# Patient Record
Sex: Male | Born: 1980 | Hispanic: Yes | Marital: Married | Smoking: Never smoker
Health system: Southern US, Community
[De-identification: ages and names within clinical notes are randomized; demographics above are authoritative.]

## PROBLEM LIST (undated history)

## (undated) DIAGNOSIS — I10 Essential (primary) hypertension: Secondary | ICD-10-CM

## (undated) DIAGNOSIS — J45909 Unspecified asthma, uncomplicated: Secondary | ICD-10-CM

---

## 2013-03-18 ENCOUNTER — Other Ambulatory Visit: Payer: Self-pay | Admitting: Occupational Medicine

## 2013-03-18 ENCOUNTER — Ambulatory Visit: Payer: Self-pay

## 2013-03-18 DIAGNOSIS — Z0000 Encounter for general adult medical examination without abnormal findings: Secondary | ICD-10-CM

## 2015-04-22 ENCOUNTER — Encounter (HOSPITAL_COMMUNITY): Payer: Self-pay | Admitting: Emergency Medicine

## 2015-04-22 ENCOUNTER — Emergency Department (HOSPITAL_COMMUNITY)
Admission: EM | Admit: 2015-04-22 | Discharge: 2015-04-23 | Disposition: A | Discharge status: Home / Self Care | Payer: 59 | Attending: Emergency Medicine | Admitting: Emergency Medicine

## 2015-04-22 DIAGNOSIS — R059 Cough, unspecified: Secondary | ICD-10-CM

## 2015-04-22 DIAGNOSIS — J45909 Unspecified asthma, uncomplicated: Secondary | ICD-10-CM | POA: Insufficient documentation

## 2015-04-22 DIAGNOSIS — I10 Essential (primary) hypertension: Secondary | ICD-10-CM | POA: Diagnosis not present

## 2015-04-22 DIAGNOSIS — R05 Cough: Secondary | ICD-10-CM | POA: Insufficient documentation

## 2015-04-22 HISTORY — DX: Essential (primary) hypertension: I10

## 2015-04-22 HISTORY — DX: Unspecified asthma, uncomplicated: J45.909

## 2015-04-22 NOTE — ED Notes (Signed)
Pt c/o SOB x1 day and non productive cough x3 weeks. One episode of emesis today after coughing, five BM today.

## 2015-04-22 NOTE — ED Provider Notes (Signed)
CSN: [id number]     Arrival date & time 04/22/15  2144 History   First MD Initiated Contact with Patient 04/22/15 2337     Chief Complaint  Patient presents with  . Cough     (Consider location/radiation/quality/duration/timing/severity/associated sxs/prior Treatment) HPI Richard Bishop is a 34 y.o. male with a history of asthma comes in for evaluation of cough. Patient states he has been coughing for the past 3 weeks. He has tried Delsym without relief. He reports associated fever and chills today. He reports his cough is nonproductive. He reports episodes of shortness of breath after coughing. He denies any chest pain, nausea or vomiting, abdominal pain, numbness or weakness, leg swelling, recent travel or surgeries, history of blood clot. No other aggravating or modifying factors. Nothing seems to make this better or worse.  Past Medical History  Diagnosis Date  . Hypertension   . Asthma    History reviewed. No pertinent past surgical history. No family history on file. Social History  Substance Use Topics  . Smoking status: Never Smoker   . Smokeless tobacco: None  . Alcohol Use: Yes    Review of Systems A 10 point review of systems was completed and was negative except for pertinent positives and negatives as mentioned in the history of present illness     Allergies  Shellfish allergy  Home Medications   Prior to Admission medications   Not on File   BP 161/89 mmHg  Pulse 90  Temp(Src) 98 F (36.7 C) (Oral)  Resp 20  SpO2 96% Physical Exam  Constitutional: He is oriented to person, place, and time. He appears well-developed and well-nourished.  HENT:  Head: Normocephalic and atraumatic.  Mouth/Throat: Oropharynx is clear and moist.  Eyes: Conjunctivae are normal. Pupils are equal, round, and reactive to light. Right eye exhibits no discharge. Left eye exhibits no discharge. No scleral icterus.  Neck: Normal range of motion. Neck supple.   Cardiovascular: Normal rate, regular rhythm and normal heart sounds.   Pulmonary/Chest: Effort normal and breath sounds normal. No respiratory distress. He has no wheezes. He has no rales.  Abdominal: Soft. There is no tenderness.  Musculoskeletal: He exhibits no tenderness.  Neurological: He is alert and oriented to person, place, and time.  Cranial Nerves II-XII grossly intact  Skin: Skin is warm and dry. No rash noted.  Psychiatric: He has a normal mood and affect.  Nursing note and vitals reviewed.   ED Course  Procedures (including critical care time) Labs Review Labs Reviewed - No data to display  Imaging Review No results found. I have personally reviewed and evaluated these images and lab results as part of my medical decision-making.   EKG Interpretation None     Meds given in ED:  Medications - No data to display  New Prescriptions   AZITHROMYCIN (ZITHROMAX) 250 MG TABLET    Take 1 tablet (250 mg total) by mouth daily. Take first 2 tablets together, then 1 every day until finished.   BENZONATATE (TESSALON) 100 MG CAPSULE    Take 1 capsule (100 mg total) by mouth every 8 (eight) hours.   DEXTROMETHORPHAN-GUAIFENESIN (MUCINEX DM) 30-600 MG PER 12 HR TABLET    Take 1 tablet by mouth 2 (two) times daily.   Filed Vitals:   04/22/15 2221  BP: 161/89  Pulse: 90  Temp: 98 F (36.7 C)  TempSrc: Oral  Resp: 20  SpO2: 96%    MDM  Patient here for evaluation of nonproductive cough  for the past 3 weeks and subjective fevers.  Vitals stable - WNL -afebrile Pt resting comfortably in ED. PE--physical exam is grossly unremarkable. Normal cardiopulmonary exam. Will treat symptomatically for cough with Tessalon and Mucinex. Given length of symptoms, will treat with azithromycin for possible pneumonia. Encourage supportive care at home. Follow-up with [facility] to establish PCP care. No evidence of other acute or emergent cardiopulmonary pathology. Low  suspicion for PE. I discussed all relevant lab findings and imaging results with pt and they verbalized understanding. Discussed f/u with PCP within 48 hrs and return precautions, pt very amenable to plan.  Final diagnoses:  Cough        Joycie Peek, PA-C 04/23/15 0030  Laurence Spates, MD 04/23/15 [phone]

## 2015-04-23 MED ORDER — DM-GUAIFENESIN ER 30-600 MG PO TB12: 1.0000 | ORAL_TABLET | Freq: Two times a day (BID) | ORAL | Status: DC

## 2015-04-23 MED ORDER — AZITHROMYCIN 250 MG PO TABS: 250.0000 mg | ORAL_TABLET | Freq: Every day | ORAL | Status: DC

## 2015-04-23 MED ORDER — BENZONATATE 100 MG PO CAPS: 100.0000 mg | ORAL_CAPSULE | Freq: Three times a day (TID) | ORAL | Status: DC

## 2015-04-23 NOTE — ED Notes (Signed)
Family members at bedside.  Pt continues to be uncooperative. 

## 2015-04-23 NOTE — ED Notes (Signed)
Blank note entry at 0006 is an error.

## 2015-04-23 NOTE — Discharge Instructions (Signed)
Please take your medications as prescribed. Follow-up with your doctor/[facility] for further evaluation management of your symptoms. Return to ED for worsening symptoms.

## 2016-08-09 ENCOUNTER — Other Ambulatory Visit: Payer: Self-pay | Admitting: Physician Assistant

## 2016-08-09 DIAGNOSIS — R748 Abnormal levels of other serum enzymes: Secondary | ICD-10-CM

## 2017-07-19 ENCOUNTER — Other Ambulatory Visit: Payer: Self-pay | Admitting: Physician Assistant

## 2017-07-19 DIAGNOSIS — R748 Abnormal levels of other serum enzymes: Secondary | ICD-10-CM

## 2018-01-07 ENCOUNTER — Other Ambulatory Visit: Payer: Self-pay | Admitting: Physician Assistant

## 2018-01-07 DIAGNOSIS — R945 Abnormal results of liver function studies: Principal | ICD-10-CM

## 2018-01-07 DIAGNOSIS — R7989 Other specified abnormal findings of blood chemistry: Secondary | ICD-10-CM

## 2018-01-14 ENCOUNTER — Other Ambulatory Visit: Payer: Self-pay

## 2018-01-24 ENCOUNTER — Other Ambulatory Visit: Payer: Self-pay

## 2018-05-22 ENCOUNTER — Ambulatory Visit: Payer: Self-pay | Admitting: Orthopedic Surgery

## 2018-05-27 ENCOUNTER — Ambulatory Visit: Payer: Self-pay | Admitting: Orthopedic Surgery

## 2018-05-27 NOTE — H&P (Signed)
Richard Bishop is an 37 y.o. male.   Chief Complaint: back and L leg pain HPI: Reason for Visit: (normal) visit for: (back); The patient is 5 months out from when symptoms began. Location (Lower Extremity): lower back pain on the left; left buttock pain Severity: pain level 4/10 Timing: constant Aggravating Factors: standing for Alleviating Factors: PT/OT; home exercise Are you working? modified duty; The patient was placed on a 10lb. lifting restriction, no bending, stooping, or squatting, and no prolonged sitting or standing. He is out of work due to light duty is not available. Medications: The patient is not taking any medication. Notes: The patient is 2 weeks and 2 days out from [facility] @ L4-5.  Past Medical History:  Diagnosis Date  . Asthma   . Hypertension     No past surgical history on file.  No family history on file. Social History:  reports that he has never smoked. He does not have any smokeless tobacco history on file. He reports that he drinks alcohol. He reports that he does not use drugs.  Allergies:  Allergies  Allergen Reactions  . Shellfish Allergy Anaphylaxis, Swelling, Rash and Other (See Comments)    *has to be fresh* Swelling of mouth     (Not in a hospital admission)  No results found for this or any previous visit (from the past 48 hour(s)). No results found.  Review of Systems  Constitutional: Negative.   HENT: Negative.   Eyes: Negative.   Respiratory: Negative.   Cardiovascular: Negative.   Gastrointestinal: Negative.   Genitourinary: Negative.   Musculoskeletal: Positive for back pain.  Skin: Negative.   Neurological: Positive for sensory change and focal weakness.  Psychiatric/Behavioral: Negative.     There were no vitals taken for this visit. Physical Exam  Constitutional: He is oriented to person, place, and time. He appears well-developed and well-nourished.  HENT:  Head: Normocephalic.  Eyes: Pupils are equal,  round, and reactive to light.  Neck: Normal range of motion.  Cardiovascular: Normal rate.  Respiratory: Effort normal.  GI: Soft.  Musculoskeletal:  Patient is a 37-year-old male.  Gait and Station: Appearance: ambulating with no assistive devices and antalgic gait.  Constitutional: General Appearance: healthy-appearing and distress (mild).  Psychiatric: Mood and Affect: active and alert.  Cardiovascular System: Edema Right: none; Dorsalis and posterior tibial pulses 2+. Edema Left: none.  Abdomen: Inspection and Palpation: non-distended and no tenderness.  Skin: Inspection and palpation: no rash.  Lumbar Spine: Inspection: normal alignment. Bony Palpation of the Lumbar Spine: tender at lumbosacral junction.. Bony Palpation of the Right Hip: no tenderness of the greater trochanter and tenderness of the SI joint; Pelvis stable. Bony Palpation of the Left Hip: no tenderness of the greater trochanter and tenderness of the SI joint. Soft Tissue Palpation on the Right: No flank pain with percussion. Active Range of Motion: limited flexion and extention.  Motor Strength: L1 Motor Strength on the Right: hip flexion iliopsoas 5/5. L1 Motor Strength on the Left: hip flexion iliopsoas 5/5. L2-L4 Motor Strength on the Right: knee extension quadriceps 5/5. L2-L4 Motor Strength on the Left: knee extension quadriceps 5/5. L5 Motor Strength on the Right: ankle dorsiflexion tibialis anterior 5/5 and great toe extension extensor hallucis longus 5/5. L5 Motor Strength on the Left: ankle dorsiflexion tibialis anterior 5/5 and great toe extension extensor hallucis longus 4/5. S1 Motor Strength on the Right: plantar flexion gastrocnemius 5/5. S1 Motor Strength on the Left: plantar flexion gastrocnemius 5/5.  Neurological System:   Knee Reflex Right: normal (2). Knee Reflex Left: normal (2). Ankle Reflex Right: normal (2). Ankle Reflex Left: normal (2). Babinski Reflex Right: plantar reflex absent. Babinski Reflex  Left: plantar reflex absent. Sensation on the Right: normal distal extremities. Sensation on the Left: normal distal extremities. Special Tests on the Right: no clonus of the ankle/knee and seated straight leg raising test positive. Special Tests on the Left: no clonus of the ankle/knee and seated straight leg raising test positive.  Normal cervical lordosis. No pain with range of motion. No palpable tenderness. Motor is 5/5 in all groups in the upper extremities. Upper extremity sensory exam normal. Patient is normoreflexic in the upper extremities. No Hoffmann sign.  Neurological: He is alert and oriented to person, place, and time.    Rereview his MRI demonstrates a moderately large disc herniation 4 5 paracentral to the left displacing the L5 nerve root against the lamina. Repeat AP x-ray of the lumbar spine demonstrates a mild scoliosis. Less than when initially seen.  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  He has had increasing weakness since seen last. He had minimal relief from an epidural steroid injection. He has undergone an extensive conservative course including form of supervised physical therapy, activity modification, light duty.  This point time we discussed 2 options either living with the symptoms versus consideration of a microlumbar decompression.  Patient reports he is unable to work is unable to increase his lifting without radicular pain. Given the failure to improve with conservative treatment and in the presence of an increasing neurologic deficit we discussed proceeding with microlumbar decompression at L4-5 on the left.  I had an extensive discussion with the patient concerning the pathology relevant anatomy and treatment options. At this point exhausting conservative treatment and in the presence of a neurologic deficit we discussed microlumbar decompression. I discussed the risks and  benefits including bleeding, infection, DVT, PE, anesthetic complications, worsening in their symptoms, improvement in their symptoms, C SF leakage, epidural fibrosis, need for future surgeries such as revision discectomy and lumbar fusion. I also indicated that this is an operation to basically decompress the nerve root to allow recovery as opposed to fixing a herniated disc and that the incidence of recurrent chest disc herniation can approach 15%. Also that nerve root recovery is variable and may not recover completely.  I discussed the operative course including overnight in the hospital. Immediate ambulation. Follow-up in 2 weeks for suture removal. 6 weeks until healing of the herniation followed by 6 weeks of reconditioning and strengthening of the core musculature. Also discussed the need to employ the concepts of disc pressure management and core motion following the surgery to minimize the risk of recurrent disc herniation. We will obtain preoperative clearance i if necessary and proceed accordingly.  Patient is otherwise healthy other than hypertension. He reports it is controlled on medication.  No history of MRSA. No family history of early heart attack or stroke.  I do feel this is related to his initial work related injury.  We discussed after that however most likely restrictions and the medium job classification due to his two level disc degeneration and residual from his disc herniation.  Continue with his current work status.  Light duty to consist of no lifting over 10 pounds, no prolonged sitting or standing over an hour at a time without the ability to change positions, no repetitive or unsupported bending. No climbing. Occasional squatting only.    Plan microlumbar decompression L4-5 left  Titan Karner M., PA-C for Dr. Beane 05/27/2018, 1:40 PM   

## 2018-05-27 NOTE — H&P (View-Only) (Signed)
Richard Bishop is an 37 y.o. male.   Chief Complaint: back and L leg pain HPI: Reason for Visit: (normal) visit for: (back); The patient is 5 months out from when symptoms began. Location (Lower Extremity): lower back pain on the left; left buttock pain Severity: pain level 4/10 Timing: constant Aggravating Factors: standing for Alleviating Factors: PT/OT; home exercise Are you working? modified duty; The patient was placed on a 10lb. lifting restriction, no bending, stooping, or squatting, and no prolonged sitting or standing. He is out of work due to [employer] duty is not available. Medications: The patient is not taking any medication. Notes: The patient is 2 weeks and 2 days out from [facility] @ L4-5.  Past Medical History:  Diagnosis Date  . Asthma   . Hypertension     No past surgical history on file.  No family history on file. Social History:  reports that he has never smoked. He does not have any smokeless tobacco history on file. He reports that he drinks alcohol. He reports that he does not use drugs.  Allergies:  Allergies  Allergen Reactions  . Shellfish Allergy Anaphylaxis, Swelling, Rash and Other (See Comments)    *has to be fresh* Swelling of mouth     (Not in a hospital admission)  No results found for this or any previous visit (from the past 48 hour(s)). No results found.  Review of Systems  Constitutional: Negative.   HENT: Negative.   Eyes: Negative.   Respiratory: Negative.   Cardiovascular: Negative.   Gastrointestinal: Negative.   Genitourinary: Negative.   Musculoskeletal: Positive for back pain.  Skin: Negative.   Neurological: Positive for sensory change and focal weakness.  Psychiatric/Behavioral: Negative.     There were no vitals taken for this visit. Physical Exam  Constitutional: He is oriented to person, place, and time. He appears well-developed and well-nourished.  HENT:  Head: Normocephalic.  Eyes: Pupils are equal,  round, and reactive to light.  Neck: Normal range of motion.  Cardiovascular: Normal rate.  Respiratory: Effort normal.  GI: Soft.  Musculoskeletal:  Patient is a 37 year old male.  Gait and Station: Appearance: ambulating with no assistive devices and antalgic gait.  Constitutional: General Appearance: healthy-appearing and distress (mild).  Psychiatric: Mood and Affect: active and alert.  Cardiovascular System: Edema Right: none; Dorsalis and posterior tibial pulses 2+. Edema Left: none.  Abdomen: Inspection and Palpation: non-distended and no tenderness.  Skin: Inspection and palpation: no rash.  Lumbar Spine: Inspection: normal alignment. Bony Palpation of the Lumbar Spine: tender at lumbosacral junction.. Bony Palpation of the Right Hip: no tenderness of the greater trochanter and tenderness of the SI joint; Pelvis stable. Bony Palpation of the Left Hip: no tenderness of the greater trochanter and tenderness of the SI joint. Soft Tissue Palpation on the Right: No flank pain with percussion. Active Range of Motion: limited flexion and extention.  Motor Strength: L1 Motor Strength on the Right: hip flexion iliopsoas 5/5. L1 Motor Strength on the Left: hip flexion iliopsoas 5/5. L2-L4 Motor Strength on the Right: knee extension quadriceps 5/5. L2-L4 Motor Strength on the Left: knee extension quadriceps 5/5. L5 Motor Strength on the Right: ankle dorsiflexion tibialis anterior 5/5 and great toe extension extensor hallucis longus 5/5. L5 Motor Strength on the Left: ankle dorsiflexion tibialis anterior 5/5 and great toe extension extensor hallucis longus 4/5. S1 Motor Strength on the Right: plantar flexion gastrocnemius 5/5. S1 Motor Strength on the Left: plantar flexion gastrocnemius 5/5.  Neurological System:  Knee Reflex Right: normal (2). Knee Reflex Left: normal (2). Ankle Reflex Right: normal (2). Ankle Reflex Left: normal (2). Babinski Reflex Right: plantar reflex absent. Babinski Reflex  Left: plantar reflex absent. Sensation on the Right: normal distal extremities. Sensation on the Left: normal distal extremities. Special Tests on the Right: no clonus of the ankle/knee and seated straight leg raising test positive. Special Tests on the Left: no clonus of the ankle/knee and seated straight leg raising test positive.  Normal cervical lordosis. No pain with range of motion. No palpable tenderness. Motor is 5/5 in all groups in the upper extremities. Upper extremity sensory exam normal. Patient is normoreflexic in the upper extremities. No Hoffmann sign.  Neurological: He is alert and oriented to person, place, and time.    Rereview his MRI demonstrates a moderately large disc herniation 4 5 paracentral to the left displacing the L5 nerve root against the lamina. Repeat AP x-ray of the lumbar spine demonstrates a mild scoliosis. Less than when initially seen.  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  He has had increasing weakness since seen last. He had minimal relief from an epidural steroid injection. He has undergone an extensive conservative course including form of supervised physical therapy, activity modification, light duty.  This point time we discussed 2 options either living with the symptoms versus consideration of a microlumbar decompression.  Patient reports he is unable to work is unable to increase his lifting without radicular pain. Given the failure to improve with conservative treatment and in the presence of an increasing neurologic deficit we discussed proceeding with microlumbar decompression at L4-5 on the left.  I had an extensive discussion with the patient concerning the pathology relevant anatomy and treatment options. At this point exhausting conservative treatment and in the presence of a neurologic deficit we discussed microlumbar decompression. I discussed the risks and  benefits including bleeding, infection, DVT, PE, anesthetic complications, worsening in their symptoms, improvement in their symptoms, C SF leakage, epidural fibrosis, need for future surgeries such as revision discectomy and lumbar fusion. I also indicated that this is an operation to basically decompress the nerve root to allow recovery as opposed to fixing a herniated disc and that the incidence of recurrent chest disc herniation can approach 15%. Also that nerve root recovery is variable and may not recover completely.  I discussed the operative course including overnight in the hospital. Immediate ambulation. Follow-up in 2 weeks for suture removal. 6 weeks until healing of the herniation followed by 6 weeks of reconditioning and strengthening of the core musculature. Also discussed the need to employ the concepts of disc pressure management and core motion following the surgery to minimize the risk of recurrent disc herniation. We will obtain preoperative clearance i if necessary and proceed accordingly.  Patient is otherwise healthy other than hypertension. He reports it is controlled on medication.  No history of MRSA. No family history of early heart attack or stroke.  I do feel this is related to his initial work related injury.  We discussed after that however most likely restrictions and the medium job classification due to his two level disc degeneration and residual from his disc herniation.  Continue with his current work status.  Light duty to consist of no lifting over 10 pounds, no prolonged sitting or standing over an hour at a time without the ability to change positions, no repetitive or unsupported bending. No climbing. Occasional squatting only.  Plan microlumbar decompression L4-5 left  BISSELL, Dayna Barker., PA-C for Dr. Shelle Iron 05/27/2018, 1:40 PM

## 2018-05-29 NOTE — Pre-Procedure Instructions (Signed)
[facility]  05/29/2018      [id number] [place],  - [address] AT [facility] [address] [place] [address] Phone: [phone] Fax: [phone]    Your procedure is scheduled on October 24th.  Report to [facility] Admitting at 8:45 A.M.  Call this number if you have problems the morning of surgery:  [phone]   Remember:  Do not eat or drink after midnight.    7 days prior to surgery STOP taking any Aspirin (unless otherwise instructed by your surgeon), Aleve, Naproxen, Ibuprofen, Motrin, Advil, Goody's, BC's, all herbal medications, fish oil, and all vitamins      Do not wear jewelry, make-up or nail polish.  Do not wear lotions, powders, or perfumes, or deodorant.  Do not shave 48 hours prior to surgery.  Men may shave face and neck.  Do not bring valuables to the hospital.  [facility] is not responsible for any belongings or valuables.   [facility]- Preparing For Surgery  Before surgery, you can play an important role. Because skin is not sterile, your skin needs to be as free of germs as possible. You can reduce the number of germs on your skin by washing with CHG (chlorahexidine gluconate) Soap before surgery.  CHG is an antiseptic cleaner which kills germs and bonds with the skin to continue killing germs even after washing.    Oral Hygiene is also important to reduce your risk of infection.  Remember - BRUSH YOUR TEETH THE MORNING OF SURGERY WITH YOUR REGULAR TOOTHPASTE  Please do not use if you have an allergy to CHG or antibacterial soaps. If your skin becomes reddened/irritated stop using the CHG.  Do not shave (including legs and underarms) for at least 48 hours prior to first CHG shower. It is OK to shave your face.  Please follow these instructions carefully.   1. Shower the NIGHT BEFORE SURGERY and the MORNING OF SURGERY with CHG.   2. If you chose to  wash your hair, wash your hair first as usual with your normal shampoo.  3. After you shampoo, rinse your hair and body thoroughly to remove the shampoo.  4. Use CHG as you would any other liquid soap. You can apply CHG directly to the skin and wash gently with a scrungie or a clean washcloth.   5. Apply the CHG Soap to your body ONLY FROM THE NECK DOWN.  Do not use on open wounds or open sores. Avoid contact with your eyes, ears, mouth and genitals (private parts). Wash Face and genitals (private parts)  with your normal soap.  6. Wash thoroughly, paying special attention to the area where your surgery will be performed.  7. Thoroughly rinse your body with warm water from the neck down.  8. DO NOT shower/wash with your normal soap after using and rinsing off the CHG Soap.  9. Pat yourself dry with a CLEAN TOWEL.  10. Wear CLEAN PAJAMAS to bed the night before surgery, wear comfortable clothes the morning of surgery  11. Place CLEAN SHEETS on your bed the night of your first shower and DO NOT SLEEP WITH PETS.    Day of Surgery:  Do not apply any deodorants/lotions.  Please wear clean clothes to the hospital/surgery center.   Remember to brush your teeth WITH YOUR REGULAR TOOTHPASTE.    Contacts, dentures or bridgework may not be worn  into surgery.  Leave your suitcase in the car.  After surgery it may be brought to your room.  For patients admitted to the hospital, discharge time will be determined by your treatment team.  Patients discharged the day of surgery will not be allowed to drive home.   Please read over the following fact sheets that you were given. Coughing and Deep Breathing and Surgical Site Infection Prevention

## 2018-05-30 ENCOUNTER — Encounter (HOSPITAL_COMMUNITY)
Admission: RE | Admit: 2018-05-30 | Discharge: 2018-05-30 | Disposition: A | Discharge status: Home / Self Care | Payer: BLUE CROSS/BLUE SHIELD | Source: Ambulatory Visit | Attending: Specialist | Admitting: Specialist

## 2018-05-30 ENCOUNTER — Encounter (HOSPITAL_COMMUNITY)
Admission: RE | Admit: 2018-05-30 | Discharge: 2018-05-30 | Disposition: A | Discharge status: Home / Self Care | Payer: BLUE CROSS/BLUE SHIELD | Source: Ambulatory Visit | Attending: Orthopedic Surgery | Admitting: Orthopedic Surgery

## 2018-05-30 ENCOUNTER — Other Ambulatory Visit: Payer: Self-pay

## 2018-05-30 ENCOUNTER — Encounter (HOSPITAL_COMMUNITY): Payer: Self-pay

## 2018-05-30 DIAGNOSIS — Z01818 Encounter for other preprocedural examination: Secondary | ICD-10-CM | POA: Insufficient documentation

## 2018-05-30 DIAGNOSIS — M5126 Other intervertebral disc displacement, lumbar region: Secondary | ICD-10-CM

## 2018-05-30 DIAGNOSIS — Z01812 Encounter for preprocedural laboratory examination: Secondary | ICD-10-CM | POA: Diagnosis present

## 2018-05-30 LAB — CBC
HEMATOCRIT: 45.2 % (ref 39.0–52.0)
HEMOGLOBIN: 15.2 g/dL (ref 13.0–17.0)
MCH: 30.5 pg (ref 26.0–34.0)
MCHC: 33.6 g/dL (ref 30.0–36.0)
MCV: 90.8 fL (ref 80.0–100.0)
Platelets: 293 10*3/uL (ref 150–400)
RBC: 4.98 MIL/uL (ref 4.22–5.81)
RDW: 11.9 % (ref 11.5–15.5)
WBC: 5.2 10*3/uL (ref 4.0–10.5)
nRBC: 0 % (ref 0.0–0.2)

## 2018-05-30 LAB — BASIC METABOLIC PANEL
Anion gap: 7 (ref 5–15)
BUN: 9 mg/dL (ref 6–20)
CHLORIDE: 105 mmol/L (ref 98–111)
CO2: 22 mmol/L (ref 22–32)
Calcium: 8.7 mg/dL — ABNORMAL LOW (ref 8.9–10.3)
Creatinine, Ser: 0.8 mg/dL (ref 0.61–1.24)
GFR calc Af Amer: >60 mL/min (ref >60)
GFR calc non Af Amer: >60 mL/min (ref >60)
GLUCOSE: 238 mg/dL — AB (ref 70–99)
POTASSIUM: 3.8 mmol/L (ref 3.5–5.1)
Sodium: 134 mmol/L — ABNORMAL LOW (ref 135–145)

## 2018-05-30 LAB — SURGICAL PCR SCREEN
MRSA, PCR: NEGATIVE
STAPHYLOCOCCUS AUREUS: POSITIVE — AB

## 2018-05-30 LAB — HEMOGLOBIN A1C
Hgb A1c MFr Bld: 6 % — ABNORMAL HIGH (ref 4.8–5.6)
Mean Plasma Glucose: 125.5 mg/dL

## 2018-05-30 NOTE — Progress Notes (Signed)
Denies any murmur, cp, sob. PCP is Dr. Nicole Cella Scifres LOV 11/2017 He is comfortable speaking english throughout the PAT process.  I asked several times if he wanted an interpreter.  "no"

## 2018-05-30 NOTE — Progress Notes (Signed)
Surgical PCR +staph. Mupirocin called into [facility] [phone]. Informed patient of result and told patient to pick up Rx and start using as soon as possible. Verbalized understanding.

## 2018-05-30 NOTE — Progress Notes (Signed)
   05/30/18 1421  OBSTRUCTIVE SLEEP APNEA  Score 5 or greater  Results sent to PCP

## 2018-06-02 ENCOUNTER — Ambulatory Visit: Payer: Self-pay | Admitting: Orthopedic Surgery

## 2018-06-04 MED ORDER — DEXTROSE 5 % IV SOLN
3.0000 g | INTRAVENOUS | Status: AC
  Administered 2018-06-05: 3 g via INTRAVENOUS
  Filled 2018-06-04: qty 3

## 2018-06-04 MED ORDER — CLINDAMYCIN PHOSPHATE 900 MG/50ML IV SOLN
900.0000 mg | INTRAVENOUS | Status: AC
  Administered 2018-06-05: 900 mg via INTRAVENOUS
  Filled 2018-06-04: qty 50

## 2018-06-04 MED ORDER — ACETAMINOPHEN 10 MG/ML IV SOLN
1000.0000 mg | INTRAVENOUS | Status: AC
  Administered 2018-06-05: 1000 mg via INTRAVENOUS
  Filled 2018-06-04: qty 100

## 2018-06-04 NOTE — Anesthesia Preprocedure Evaluation (Addendum)
Anesthesia Evaluation  Patient identified by MRN, date of birth, ID band Patient awake    Reviewed: Allergy & Precautions, NPO status , Patient's Chart, lab work & pertinent test results  History of Anesthesia Complications Negative for: history of anesthetic complications  Airway Mallampati: II  TM Distance: >3 FB Neck ROM: Full    Dental no notable dental hx. (+) Teeth Intact, Dental Advisory Given   Pulmonary neg pulmonary ROS,    Pulmonary exam normal breath sounds clear to auscultation       Cardiovascular hypertension, Pt. 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 negative psych ROS   GI/Hepatic negative GI ROS, Neg liver ROS,   Endo/Other  Morbid obesity  Renal/GU negative Renal ROS     Musculoskeletal negative musculoskeletal ROS (+)   Abdominal (+) + obese,   Peds  Hematology negative hematology ROS (+)   Anesthesia Other Findings   Reproductive/Obstetrics                            Anesthesia Physical Anesthesia Plan  ASA: III  Anesthesia Plan: General   Post-op Pain Management:    Induction: Intravenous  PONV Risk Score and Plan: 2 and Treatment may vary due to age or medical condition, Ondansetron and Dexamethasone  Airway Management Planned: Oral ETT  Additional Equipment:   Intra-op Plan:   Post-operative Plan: Extubation in OR  Informed Consent: I have reviewed the patients History and Physical, chart, labs and discussed the procedure including the risks, benefits and alternatives for the proposed anesthesia with the patient or authorized representative who has indicated his/her understanding and acceptance.   Dental advisory given  Plan Discussed with: CRNA and Surgeon  Anesthesia Plan Comments:        Anesthesia Quick Evaluation

## 2018-06-05 ENCOUNTER — Other Ambulatory Visit: Payer: Self-pay

## 2018-06-05 ENCOUNTER — Ambulatory Visit (HOSPITAL_COMMUNITY)
Admission: RE | Disposition: A | Discharge status: Home / Self Care | Payer: Self-pay | Source: Ambulatory Visit | Attending: Specialist

## 2018-06-05 ENCOUNTER — Ambulatory Visit (HOSPITAL_COMMUNITY): Payer: BLUE CROSS/BLUE SHIELD

## 2018-06-05 ENCOUNTER — Encounter (HOSPITAL_COMMUNITY): Payer: Self-pay | Admitting: *Deleted

## 2018-06-05 ENCOUNTER — Ambulatory Visit (HOSPITAL_COMMUNITY): Payer: BLUE CROSS/BLUE SHIELD | Admitting: Physician Assistant

## 2018-06-05 ENCOUNTER — Ambulatory Visit (HOSPITAL_COMMUNITY)
Admission: RE | Admit: 2018-06-05 | Discharge: 2018-06-06 | Disposition: A | Discharge status: Home / Self Care | Payer: BLUE CROSS/BLUE SHIELD | Source: Ambulatory Visit | Attending: Specialist | Admitting: Specialist

## 2018-06-05 ENCOUNTER — Ambulatory Visit (HOSPITAL_COMMUNITY): Payer: BLUE CROSS/BLUE SHIELD | Admitting: Anesthesiology

## 2018-06-05 DIAGNOSIS — Z91013 Allergy to seafood: Secondary | ICD-10-CM | POA: Diagnosis not present

## 2018-06-05 DIAGNOSIS — R7303 Prediabetes: Secondary | ICD-10-CM

## 2018-06-05 DIAGNOSIS — Z79899 Other long term (current) drug therapy: Secondary | ICD-10-CM | POA: Diagnosis not present

## 2018-06-05 DIAGNOSIS — M5126 Other intervertebral disc displacement, lumbar region: Secondary | ICD-10-CM | POA: Diagnosis not present

## 2018-06-05 DIAGNOSIS — Z6841 Body Mass Index (BMI) 40.0 and over, adult: Secondary | ICD-10-CM | POA: Insufficient documentation

## 2018-06-05 DIAGNOSIS — R739 Hyperglycemia, unspecified: Secondary | ICD-10-CM | POA: Diagnosis not present

## 2018-06-05 DIAGNOSIS — M48061 Spinal stenosis, lumbar region without neurogenic claudication: Secondary | ICD-10-CM | POA: Diagnosis present

## 2018-06-05 DIAGNOSIS — I10 Essential (primary) hypertension: Secondary | ICD-10-CM

## 2018-06-05 DIAGNOSIS — Z419 Encounter for procedure for purposes other than remedying health state, unspecified: Secondary | ICD-10-CM

## 2018-06-05 HISTORY — PX: LUMBAR LAMINECTOMY/DECOMPRESSION MICRODISCECTOMY: SHX5026

## 2018-06-05 SURGERY — LUMBAR LAMINECTOMY/DECOMPRESSION MICRODISCECTOMY 1 LEVEL
Anesthesia: General | Site: Spine Lumbar | Laterality: Left

## 2018-06-05 MED ORDER — ONDANSETRON HCL 4 MG/2ML IJ SOLN
INTRAMUSCULAR | Status: DC | PRN
  Administered 2018-06-05: 4 mg via INTRAVENOUS

## 2018-06-05 MED ORDER — MIDAZOLAM HCL 5 MG/5ML IJ SOLN
INTRAMUSCULAR | Status: DC | PRN
  Administered 2018-06-05: 2 mg via INTRAVENOUS

## 2018-06-05 MED ORDER — DOCUSATE SODIUM 100 MG PO CAPS: 100.0000 mg | ORAL_CAPSULE | Freq: Two times a day (BID) | ORAL | 2 refills | Status: AC

## 2018-06-05 MED ORDER — MIDAZOLAM HCL 2 MG/2ML IJ SOLN
INTRAMUSCULAR | Status: AC
  Filled 2018-06-05: qty 2

## 2018-06-05 MED ORDER — SODIUM CHLORIDE 0.9 % IV SOLN
INTRAVENOUS | Status: DC | PRN
  Administered 2018-06-05: 13:00:00

## 2018-06-05 MED ORDER — INSULIN ASPART 100 UNIT/ML ~~LOC~~ SOLN: 0.0000 [IU] | Freq: Three times a day (TID) | SUBCUTANEOUS | Status: DC

## 2018-06-05 MED ORDER — BUPIVACAINE-EPINEPHRINE 0.5% -1:200000 IJ SOLN
INTRAMUSCULAR | Status: AC
  Filled 2018-06-05: qty 1

## 2018-06-05 MED ORDER — METHOCARBAMOL 1000 MG/10ML IJ SOLN: 500.0000 mg | Freq: Four times a day (QID) | INTRAVENOUS | Status: DC | PRN

## 2018-06-05 MED ORDER — METHOCARBAMOL 500 MG PO TABS: 500.0000 mg | ORAL_TABLET | Freq: Four times a day (QID) | ORAL | 1 refills | Status: AC | PRN

## 2018-06-05 MED ORDER — OXYCODONE HCL 5 MG/5ML PO SOLN: 5.0000 mg | Freq: Once | ORAL | Status: DC | PRN

## 2018-06-05 MED ORDER — DOCUSATE SODIUM 100 MG PO CAPS
100.0000 mg | ORAL_CAPSULE | Freq: Two times a day (BID) | ORAL | Status: DC
  Administered 2018-06-05: 100 mg via ORAL
  Filled 2018-06-05: qty 1

## 2018-06-05 MED ORDER — OXYCODONE HCL 5 MG PO TABS
10.0000 mg | ORAL_TABLET | ORAL | Status: DC | PRN
  Administered 2018-06-05 – 2018-06-06 (×4): 10 mg via ORAL
  Filled 2018-06-05 (×4): qty 2

## 2018-06-05 MED ORDER — POLYETHYLENE GLYCOL 3350 17 G PO PACK: 17.0000 g | PACK | Freq: Every day | ORAL | 0 refills | Status: AC

## 2018-06-05 MED ORDER — PROPOFOL 10 MG/ML IV BOLUS
INTRAVENOUS | Status: AC
  Filled 2018-06-05: qty 20

## 2018-06-05 MED ORDER — LISINOPRIL 20 MG PO TABS: 40.0000 mg | ORAL_TABLET | Freq: Every day | ORAL | Status: DC

## 2018-06-05 MED ORDER — PROMETHAZINE HCL 25 MG/ML IJ SOLN: 6.2500 mg | INTRAMUSCULAR | Status: DC | PRN

## 2018-06-05 MED ORDER — BUPIVACAINE-EPINEPHRINE 0.5% -1:200000 IJ SOLN
INTRAMUSCULAR | Status: DC | PRN
  Administered 2018-06-05: 14 mL

## 2018-06-05 MED ORDER — LACTATED RINGERS IV SOLN
INTRAVENOUS | Status: DC
  Administered 2018-06-05: 10:00:00 via INTRAVENOUS

## 2018-06-05 MED ORDER — EPHEDRINE SULFATE-NACL 50-0.9 MG/10ML-% IV SOSY
PREFILLED_SYRINGE | INTRAVENOUS | Status: DC | PRN
  Administered 2018-06-05: 5 mg via INTRAVENOUS

## 2018-06-05 MED ORDER — FENTANYL CITRATE (PF) 250 MCG/5ML IJ SOLN
INTRAMUSCULAR | Status: AC
  Filled 2018-06-05: qty 5

## 2018-06-05 MED ORDER — CEFAZOLIN SODIUM-DEXTROSE 2-4 GM/100ML-% IV SOLN
2.0000 g | Freq: Three times a day (TID) | INTRAVENOUS | Status: AC
  Administered 2018-06-05 – 2018-06-06 (×2): 2 g via INTRAVENOUS
  Filled 2018-06-05 (×2): qty 100

## 2018-06-05 MED ORDER — OXYCODONE HCL 5 MG PO TABS: 5.0000 mg | ORAL_TABLET | Freq: Once | ORAL | Status: DC | PRN

## 2018-06-05 MED ORDER — FENTANYL CITRATE (PF) 100 MCG/2ML IJ SOLN
INTRAMUSCULAR | Status: DC | PRN
  Administered 2018-06-05 (×2): 100 ug via INTRAVENOUS
  Administered 2018-06-05: 50 ug via INTRAVENOUS

## 2018-06-05 MED ORDER — MAGNESIUM CITRATE PO SOLN: 1.0000 | Freq: Once | ORAL | Status: DC | PRN

## 2018-06-05 MED ORDER — BISACODYL 5 MG PO TBEC: 5.0000 mg | DELAYED_RELEASE_TABLET | Freq: Every day | ORAL | Status: DC | PRN

## 2018-06-05 MED ORDER — ACETAMINOPHEN 325 MG PO TABS: 650.0000 mg | ORAL_TABLET | ORAL | Status: DC | PRN

## 2018-06-05 MED ORDER — SODIUM CHLORIDE 0.9 % IV SOLN
INTRAVENOUS | Status: DC | PRN
  Administered 2018-06-05: 25 ug/min via INTRAVENOUS

## 2018-06-05 MED ORDER — POTASSIUM CHLORIDE IN NACL 20-0.9 MEQ/L-% IV SOLN
INTRAVENOUS | Status: DC
  Administered 2018-06-05: 20:00:00 via INTRAVENOUS

## 2018-06-05 MED ORDER — OXYCODONE HCL 5 MG PO TABS: 5.0000 mg | ORAL_TABLET | ORAL | Status: DC | PRN

## 2018-06-05 MED ORDER — HYDROMORPHONE HCL 1 MG/ML IJ SOLN: 1.0000 mg | INTRAMUSCULAR | Status: DC | PRN

## 2018-06-05 MED ORDER — ACETAMINOPHEN 650 MG RE SUPP: 650.0000 mg | RECTAL | Status: DC | PRN

## 2018-06-05 MED ORDER — OXYCODONE HCL 5 MG PO TABS: 5.0000 mg | ORAL_TABLET | ORAL | 0 refills | Status: AC | PRN

## 2018-06-05 MED ORDER — THROMBIN 20000 UNITS EX SOLR
CUTANEOUS | Status: DC | PRN
  Administered 2018-06-05: 13:00:00 via TOPICAL

## 2018-06-05 MED ORDER — ACETAMINOPHEN 10 MG/ML IV SOLN: 1000.0000 mg | Freq: Once | INTRAVENOUS | Status: DC | PRN

## 2018-06-05 MED ORDER — METHOCARBAMOL 500 MG PO TABS
500.0000 mg | ORAL_TABLET | Freq: Four times a day (QID) | ORAL | Status: DC | PRN
  Administered 2018-06-05 – 2018-06-06 (×2): 500 mg via ORAL
  Filled 2018-06-05 (×2): qty 1

## 2018-06-05 MED ORDER — PROPOFOL 10 MG/ML IV BOLUS
INTRAVENOUS | Status: DC | PRN
  Administered 2018-06-05: 200 mg via INTRAVENOUS

## 2018-06-05 MED ORDER — FENTANYL CITRATE (PF) 100 MCG/2ML IJ SOLN
INTRAMUSCULAR | Status: AC
  Filled 2018-06-05: qty 2

## 2018-06-05 MED ORDER — 0.9 % SODIUM CHLORIDE (POUR BTL) OPTIME
TOPICAL | Status: DC | PRN
  Administered 2018-06-05: 1000 mL

## 2018-06-05 MED ORDER — LACTATED RINGERS IV SOLN
INTRAVENOUS | Status: DC | PRN
  Administered 2018-06-05 (×2): via INTRAVENOUS

## 2018-06-05 MED ORDER — PHENYLEPHRINE 40 MCG/ML (10ML) SYRINGE FOR IV PUSH (FOR BLOOD PRESSURE SUPPORT)
PREFILLED_SYRINGE | INTRAVENOUS | Status: DC | PRN
  Administered 2018-06-05: 120 ug via INTRAVENOUS
  Administered 2018-06-05: 80 ug via INTRAVENOUS
  Administered 2018-06-05: 40 ug via INTRAVENOUS
  Administered 2018-06-05: 80 ug via INTRAVENOUS

## 2018-06-05 MED ORDER — FENTANYL CITRATE (PF) 100 MCG/2ML IJ SOLN
25.0000 ug | INTRAMUSCULAR | Status: DC | PRN
  Administered 2018-06-05 (×2): 50 ug via INTRAVENOUS

## 2018-06-05 MED ORDER — SUGAMMADEX SODIUM 200 MG/2ML IV SOLN
INTRAVENOUS | Status: DC | PRN
  Administered 2018-06-05: 250 mg via INTRAVENOUS

## 2018-06-05 MED ORDER — THROMBIN (RECOMBINANT) 20000 UNITS EX SOLR
CUTANEOUS | Status: AC
  Filled 2018-06-05: qty 20000

## 2018-06-05 MED ORDER — LACTATED RINGERS IV SOLN: INTRAVENOUS | Status: DC

## 2018-06-05 MED ORDER — MENTHOL 3 MG MT LOZG
1.0000 | LOZENGE | OROMUCOSAL | Status: DC | PRN
  Administered 2018-06-05: 3 mg via ORAL
  Filled 2018-06-05: qty 9

## 2018-06-05 MED ORDER — ROCURONIUM BROMIDE 10 MG/ML (PF) SYRINGE
PREFILLED_SYRINGE | INTRAVENOUS | Status: DC | PRN
  Administered 2018-06-05 (×2): 10 mg via INTRAVENOUS
  Administered 2018-06-05: 50 mg via INTRAVENOUS
  Administered 2018-06-05: 10 mg via INTRAVENOUS

## 2018-06-05 MED ORDER — POLYETHYLENE GLYCOL 3350 17 G PO PACK: 17.0000 g | PACK | Freq: Every day | ORAL | Status: DC | PRN

## 2018-06-05 MED ORDER — RISAQUAD PO CAPS
1.0000 | ORAL_CAPSULE | Freq: Every day | ORAL | Status: DC
  Administered 2018-06-05: 1 via ORAL
  Filled 2018-06-05 (×2): qty 1

## 2018-06-05 SURGICAL SUPPLY — 59 items
BAG DECANTER FOR FLEXI CONT (MISCELLANEOUS) ×3 IMPLANT
CLEANER TIP ELECTROSURG 2X2 (MISCELLANEOUS) ×3 IMPLANT
CLOSURE WOUND 1/2 X4 (GAUZE/BANDAGES/DRESSINGS) ×1
CLOTH 2% CHLOROHEXIDINE 3PK (PERSONAL CARE ITEMS) ×3 IMPLANT
CONT SPEC 4OZ CLIKSEAL STRL BL (MISCELLANEOUS) ×3 IMPLANT
COVER WAND RF STERILE (DRAPES) ×3 IMPLANT
DRAPE LAPAROTOMY 100X72X124 (DRAPES) ×3 IMPLANT
DRAPE MICROSCOPE LEICA (MISCELLANEOUS) ×3 IMPLANT
DRAPE SHEET LG 3/4 BI-LAMINATE (DRAPES) ×3 IMPLANT
DRAPE SURG 17X11 SM STRL (DRAPES) ×3 IMPLANT
DRAPE UTILITY XL STRL (DRAPES) ×3 IMPLANT
DRSG AQUACEL AG ADV 3.5X 4 (GAUZE/BANDAGES/DRESSINGS) IMPLANT
DRSG AQUACEL AG ADV 3.5X 6 (GAUZE/BANDAGES/DRESSINGS) ×3 IMPLANT
DRSG TEGADERM 4X4.75 (GAUZE/BANDAGES/DRESSINGS) ×3 IMPLANT
DRSG TELFA 3X8 NADH (GAUZE/BANDAGES/DRESSINGS) IMPLANT
DURAPREP 26ML APPLICATOR (WOUND CARE) ×3 IMPLANT
DURASEAL SPINE SEALANT 3ML (MISCELLANEOUS) IMPLANT
ELECT BLADE 4.0 EZ CLEAN MEGAD (MISCELLANEOUS) ×3
ELECT REM PT RETURN 9FT ADLT (ELECTROSURGICAL) ×3
ELECTRODE BLDE 4.0 EZ CLN MEGD (MISCELLANEOUS) ×1 IMPLANT
ELECTRODE REM PT RTRN 9FT ADLT (ELECTROSURGICAL) ×1 IMPLANT
GAUZE SPONGE 4X4 12PLY STRL LF (GAUZE/BANDAGES/DRESSINGS) ×3 IMPLANT
GLOVE BIOGEL PI IND STRL 7.0 (GLOVE) ×1 IMPLANT
GLOVE BIOGEL PI IND STRL 8 (GLOVE) ×2 IMPLANT
GLOVE BIOGEL PI INDICATOR 7.0 (GLOVE) ×2
GLOVE BIOGEL PI INDICATOR 8 (GLOVE) ×4
GLOVE ECLIPSE 7.5 STRL STRAW (GLOVE) ×6 IMPLANT
GLOVE SURG SS PI 7.5 STRL IVOR (GLOVE) ×3 IMPLANT
GLOVE SURG SS PI 8.0 STRL IVOR (GLOVE) ×9 IMPLANT
GOWN STRL REUS W/ TWL LRG LVL3 (GOWN DISPOSABLE) ×1 IMPLANT
GOWN STRL REUS W/ TWL XL LVL3 (GOWN DISPOSABLE) ×1 IMPLANT
GOWN STRL REUS W/TWL 2XL LVL3 (GOWN DISPOSABLE) ×6 IMPLANT
GOWN STRL REUS W/TWL LRG LVL3 (GOWN DISPOSABLE) ×2
GOWN STRL REUS W/TWL XL LVL3 (GOWN DISPOSABLE) ×2
IV CATH 14GX2 1/4 (CATHETERS) ×3 IMPLANT
KIT BASIN OR (CUSTOM PROCEDURE TRAY) ×3 IMPLANT
KIT POSITION SURG JACKSON T1 (MISCELLANEOUS) ×3 IMPLANT
NEEDLE 22X1 1/2 (OR ONLY) (NEEDLE) ×3 IMPLANT
NEEDLE SPNL 18GX3.5 QUINCKE PK (NEEDLE) ×6 IMPLANT
PACK LAMINECTOMY NEURO (CUSTOM PROCEDURE TRAY) ×3 IMPLANT
PATTIES SURGICAL .75X.75 (GAUZE/BANDAGES/DRESSINGS) ×3 IMPLANT
RUBBERBAND STERILE (MISCELLANEOUS) ×6 IMPLANT
SPONGE LAP 4X18 RFD (DISPOSABLE) IMPLANT
SPONGE SURGIFOAM ABS GEL 100 (HEMOSTASIS) ×3 IMPLANT
STAPLER VISISTAT (STAPLE) IMPLANT
STRIP CLOSURE SKIN 1/2X4 (GAUZE/BANDAGES/DRESSINGS) ×2 IMPLANT
SUT NURALON 4 0 TR CR/8 (SUTURE) IMPLANT
SUT PROLENE 3 0 PS 2 (SUTURE) IMPLANT
SUT VIC AB 1 CT1 27 (SUTURE) ×4
SUT VIC AB 1 CT1 27XBRD ANBCTR (SUTURE) ×2 IMPLANT
SUT VIC AB 1 CT1 27XBRD ANTBC (SUTURE) IMPLANT
SUT VIC AB 1-0 CT2 27 (SUTURE) IMPLANT
SUT VIC AB 2-0 CT1 27 (SUTURE)
SUT VIC AB 2-0 CT1 TAPERPNT 27 (SUTURE) IMPLANT
SUT VIC AB 2-0 CT2 27 (SUTURE) IMPLANT
SYR 3ML LL SCALE MARK (SYRINGE) ×3 IMPLANT
TOWEL GREEN STERILE (TOWEL DISPOSABLE) ×3 IMPLANT
TOWEL GREEN STERILE FF (TOWEL DISPOSABLE) ×3 IMPLANT
YANKAUER SUCT BULB TIP NO VENT (SUCTIONS) ×3 IMPLANT

## 2018-06-05 NOTE — Transfer of Care (Signed)
Immediate Anesthesia Transfer of Care Note  Patient: Richard Bishop  Procedure(s) Performed: Microlumbar decompression Lumbar Four - Lumbar Five left (Left Spine Lumbar)  Patient Location: PACU  Anesthesia Type:General  Level of Consciousness: awake, alert  and oriented  Airway & Oxygen Therapy: Patient Spontanous Breathing and Patient connected to face mask oxygen  Post-op Assessment: Report given to RN, Post -op Vital signs reviewed and stable and Patient moving all extremities X 4  Post vital signs: Reviewed and stable  Last Vitals:  Vitals Value Taken Time  BP 140/68 06/05/2018  2:28 PM  Temp    Pulse 90 06/05/2018  2:30 PM  Resp 16 06/05/2018  2:30 PM  SpO2 100 % 06/05/2018  2:30 PM  Vitals shown include unvalidated device data.  Last Pain:  Vitals:   06/05/18 0930  TempSrc:   PainSc: 4          Complications: No apparent anesthesia complications

## 2018-06-05 NOTE — Anesthesia Postprocedure Evaluation (Signed)
Anesthesia Post Note  Patient: Richard Bishop  Procedure(s) Performed: Microlumbar decompression Lumbar Four - Lumbar Five left (Left Spine Lumbar)     Patient location during evaluation: PACU Anesthesia Type: General Level of consciousness: awake and alert Pain management: pain level controlled Vital Signs Assessment: post-procedure vital signs reviewed and stable Respiratory status: spontaneous breathing, nonlabored ventilation and respiratory function stable Cardiovascular status: blood pressure returned to baseline and stable Postop Assessment: no apparent nausea or vomiting Anesthetic complications: no    Last Vitals:  Vitals:   06/05/18 1428 06/05/18 1429  BP: 140/68   Pulse: 86 93  Resp:  11  Temp:  (!) 36.3 C  SpO2: 100% 99%    Last Pain:  Vitals:   06/05/18 1445  TempSrc:   PainSc: (P) 6                  Kaylyn Layer

## 2018-06-05 NOTE — Anesthesia Procedure Notes (Signed)
Procedure Name: Intubation Date/Time: 06/05/2018 12:26 PM Performed by: Lanell Matar, CRNA Pre-anesthesia Checklist: Patient identified, Emergency Drugs available, Suction available and Patient being monitored Patient Re-evaluated:Patient Re-evaluated prior to induction Oxygen Delivery Method: Circle System Utilized Preoxygenation: Pre-oxygenation with 100% oxygen Induction Type: IV induction Ventilation: Two handed mask ventilation required and Oral airway inserted - appropriate to patient size Laryngoscope Size: Hyacinth Meeker and 2 Grade View: Grade II Tube type: Oral Tube size: 7.5 mm Number of attempts: 1 Airway Equipment and Method: Stylet and Oral airway Placement Confirmation: ETT inserted through vocal cords under direct vision,  positive ETCO2 and breath sounds checked- equal and bilateral Secured at: 22 cm Tube secured with: Tape Dental Injury: Teeth and Oropharynx as per pre-operative assessment

## 2018-06-05 NOTE — Op Note (Signed)
NAME: Richard Bishop, Richard Bishop MEDICAL RECORD [id number] ACCOUNT [id number] DATE OF BIRTH:December 05, 1980 FACILITY: [facility] LOCATION: [facility] PHYSICIAN:[place] Churchill Connye Burkitt, MD  OPERATIVE REPORT  DATE OF PROCEDURE:  06/05/2018  PREOPERATIVE DIAGNOSES:   1.  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 2.  Morbid obesity with a body mass index of 44.  POSTOPERATIVE DIAGNOSES:   1.  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 2.  Morbid obesity with a body mass index of 44.  PROCEDURE PERFORMED: 1.  Microlumbar decompression L4-5, left. 2.  Foraminotomy L4-L5, left. 3.  Microdiskectomy 4-5 left.  ANESTHESIA:  General.  ASSISTANT:  Andrez Grime, PA.  SPECIMEN L4-L5 disk to pathology.  HISTORY:  He is 37 with left lower extremity radicular pain.  Neural tension signs, EHL weakness secondary to large disk herniation 4-5 stenosis, elevated BMI.  Refractory conservative treatment, indicated for microlumbar decompression of the L5 nerve  root.  Risks and benefits discussed including bleeding, infection, damage to neurovascular structures, no change in symptoms, worsening symptoms, DVT, PE, anesthetic complications, etc.  DESCRIPTION OF PROCEDURE:  With the patient in supine position after induction of adequate general anesthesia, 3 grams Kefzol, placed prone on the [place] table.  All bony prominences well padded.  The patient voided just prior to the surgery.  Lumbar  region was prepped and draped in the usual sterile fashion.  Two 18-gauge spinal needles utilized to localize 4-5 interspace confirmed with x-ray.  Incision was made from the spinous process 4-5.  Subcutaneous tissue was dissected with electrocautery to  achieve hemostasis.  Large deep retractors were utilized.  Marcaine 0.25% with epinephrine was infiltrated in the paraspinous musculature.  I divided the dorsal lumbar fascia at 4-5 on the left, elevated the paraspinous musculature from  4-5.  I placed  the [facility] retractor.  Operating microscope was draped and brought in the surgical field.  Confirmatory radiograph obtained.  Hemilaminotomy of the caudad edge of 4 was performed with a 2 and a 3 mm Kerrison to the point detaching the ligamentum  flavum cephalad.  Preserved the pars.  A micro curette utilized to detach ligamentum flavum from the cephalad edge of 5.  I entered the facet on the left.  I removed a small portion of the inferior aspect of the articulating process of 4.  Identified the  superior articulating process of 5.  I used a micro curette to detach ligamentum flavum.  I used a Penfield 4 to develop a plane between the ligamentum flavum, the 5 nerve root and the lateral recess.  Ligamentum flavum removed partially from the  interspace gently mobilized the 5 nerve root medially.  I performed a generous foraminotomy.  I then decompressed the lateral recess to the medial border of the pedicle.  Continuing cephalad to the superior articulating process, I removed a portion of  that.  I performed a small foraminotomy.  Bipolar cautery was utilized to achieve hemostasis.  There was epidural venous plexus.  After immobilizing the 5 root medially, there was a large disk herniation noted.  Annulotomy performed and copious portion  of disk material was removed from the disk space with a micropituitary.  A straight upbiting after further mobilization with an Epstein and a Woodson.  Multiple fragments were retrieved.  I irrigated the disk space with catheter irrigation.  Additional  fragments were retrieved.  Confirmatory radiograph obtained at that level.  Following full diskectomy of herniated material, there was 1 cm of excursion of the 5 root medially  to the pedicle without tension.  Neuro probe passed freely out the foramen of  5 and 4 and above the pedicle of 4 without tension.  No evidence of CSF leakage or active bleeding.  Small patty of thrombin-soaked Gelfoam was placed in  the laminotomy defect.  There was no disk herniation noted within the shoulder, axilla of the root  or beneath the thecal sac.  Extra-long retractors were utilized throughout the case and long McCullough retractors, which increased the timing of the case.  I removed the McCullough retractor.  Paraspinous muscles inspected.  No evidence of active  bleeding.  I closed the dorsal lumbar fascia with #1 Vicryl interrupted figure-of-eight sutures, subcutaneous with multiple 2 layers due to the ample subcutaneous adipose tissue and the skin with staples.  The wound was dressed sterilely, placed supine  on the hospital bed, extubated without difficulty and transported to the recovery room in satisfactory condition.  The patient tolerated the procedure well.  No complications.  Assistant was [employer], [place].  Blood loss was 50 mL.  Specimen L4-5 disk to pathology.  TN/NUANCE  D:06/05/2018 T:06/05/2018 JOB:[id number]

## 2018-06-05 NOTE — Brief Op Note (Signed)
06/05/2018  5:31 PM  PATIENT:  Richard Bishop  37 y.o. male  PRE-OPERATIVE DIAGNOSIS:  HNP, stenosis L4-5 Left  POST-OPERATIVE DIAGNOSIS:  HNP, stenosis L4-5 Left  PROCEDURE:  Procedure(s) with comments: Microlumbar decompression Lumbar Four - Lumbar Five left (Left) - Microlumbar decompression Lumbar Four - Lumbar Five left  SURGEON:  Surgeon(s) and Role:    Jene Every, MD - Primary  PHYSICIAN ASSISTANT:   ASSISTANTS: Bissell   ANESTHESIA:   general  EBL:  50 mL   BLOOD ADMINISTERED:none  DRAINS: none   LOCAL MEDICATIONS USED:  MARCAINE     SPECIMEN:  Source of Specimen:  L45  DISPOSITION OF SPECIMEN:  PATHOLOGY  COUNTS:  YES  TOURNIQUET:  * No tourniquets in log *  DICTATION: .Other Dictation: Dictation Number [id number]  PLAN OF CARE: Admit for overnight observation  PATIENT DISPOSITION:  PACU - hemodynamically stable.   Delay start of Pharmacological VTE agent (>24hrs) due to surgical blood loss or risk of bleeding: yes

## 2018-06-05 NOTE — Interval H&P Note (Signed)
History and Physical Interval Note:  06/05/2018 11:30 AM  [facility]  has presented today for surgery, with the diagnosis of HNP, stenosis L4-5 Left  The various methods of treatment have been discussed with the patient and family. After consideration of risks, benefits and other options for treatment, the patient has consented to  Procedure(s) with comments: Microlumbar decompression L4-5 left (Left) - 90 mins as a surgical intervention .  The patient's history has been reviewed, patient examined, no change in status, stable for surgery.  I have reviewed the patient's chart and labs.  Questions were answered to the patient's satisfaction.     Akeisha Lagerquist C

## 2018-06-05 NOTE — Consult Note (Signed)
Medical Consultation   [place]  ZOX:[id number]  DOB: 1981/01/30  DOA: 06/05/2018  PCP: Karlyne Greenspan     Requesting physician: Dr. Shelle Iron  Reason for consultation: Hyperglycemia  History of Present Illness: Richard Bishop is an 37 y.o. male with past medical history of hypertension and asthma had elective lumbar decompression surgery today by orthopedics.  His blood sugar was elevated up.  Hospitalist service was called for consult for the same.  Patient denies known history of diabetes.  He states that he is gained a lot of weight and is not able to exercise recently since his back pain.  Patient denies any recent fever, nausea, vomiting, diarrhea or dysuria.  No chest pain or shortness of breath.     Review of Systems:  As per HPI otherwise 10 point review of systems negative.   Past Medical History: Past Medical History:  Diagnosis Date  . Asthma    as a child  . Hypertension     Past Surgical History: Had lumbar decompression surgery today  Allergies:   Allergies  Allergen Reactions  . Shellfish Allergy Anaphylaxis, Swelling, Rash and Other (See Comments)    *has to be fresh* Swelling of mouth     Social History:  reports that he has never smoked. He has never used smokeless tobacco. He reports that he drinks about 3.0 standard drinks of alcohol per week. He reports that he does not use drugs.   Family History: No family history of cancer or TB  Physical Exam: Vitals:   06/05/18 0915 06/05/18 1428 06/05/18 1429  BP: (!) 157/94 140/68   Pulse: 98 86 93  Resp: 18  11  Temp: 98.4 F (36.9 C)  (!) 97.3 F (36.3 C)  TempSrc: Oral    SpO2: 97% 100% 99%    Constitutional: NAD, calm, comfortable.  Slightly drowsy but wakes up and answers questions appropriately Eyes: PERRL, lids and conjunctivae normal ENMT: Mucous membranes are moist. Posterior pharynx clear of any exudate or lesions. Neck:  normal, supple, no masses, no thyromegaly Respiratory: bilateral decreased breath sounds at bases, no wheezing, no crackles. Normal respiratory effort. No accessory muscle use.  Cardiovascular: S1 S2 positive, rate controlled. No extremity edema. 2+ pedal pulses.  Abdomen: Obese, no tenderness, no masses palpated. No hepatosplenomegaly. Bowel sounds positive.  Musculoskeletal: no clubbing / cyanosis. No joint deformity upper and lower extremities.  Neurologic: CN 2-12 grossly intact. Moving extremities. No focal neurologic deficits.   Data reviewed:  I have personally reviewed following labs and imaging studies Labs:  CBC: Recent Labs  Lab 05/30/18 1434  WBC 5.2  HGB 15.2  HCT 45.2  MCV 90.8  PLT 293    Basic Metabolic Panel: Recent Labs  Lab 05/30/18 1434  NA 134*  K 3.8  CL 105  CO2 22  GLUCOSE 238*  BUN 9  CREATININE 0.80  CALCIUM 8.7*   GFR Estimated Creatinine Clearance: 165 mL/min (by C-G formula based on SCr of 0.8 mg/dL). Liver Function Tests: No results for input(s): AST, ALT, ALKPHOS, BILITOT, PROT, ALBUMIN in the last 168 hours. No results for input(s): LIPASE, AMYLASE in the last 168 hours. No results for input(s): AMMONIA in the last 168 hours. Coagulation profile No results for input(s): INR, PROTIME in the last 168 hours.  Cardiac Enzymes: No results for input(s): CKTOTAL, CKMB, CKMBINDEX, TROPONINI in the last 168 hours. BNP: Invalid input(s): POCBNP  CBG: No results for input(s): GLUCAP in the last 168 hours. D-Dimer No results for input(s): DDIMER in the last 72 hours. Hgb A1c No results for input(s): HGBA1C in the last 72 hours. Lipid Profile No results for input(s): CHOL, HDL, LDLCALC, TRIG, CHOLHDL, LDLDIRECT in the last 72 hours. Thyroid function studies No results for input(s): TSH, T4TOTAL, T3FREE, THYROIDAB in the last 72 hours.  Invalid input(s): FREET3 Anemia work up No results for input(s): VITAMINB12, FOLATE, FERRITIN, TIBC, IRON,  RETICCTPCT in the last 72 hours. Urinalysis No results found for: COLORURINE, APPEARANCEUR, LABSPEC, PHURINE, GLUCOSEU, HGBUR, BILIRUBINUR, KETONESUR, PROTEINUR, UROBILINOGEN, NITRITE, LEUKOCYTESUR   Microbiology Recent Results (from the past 240 hour(s))  Surgical pcr screen     Status: Abnormal   Collection Time: 05/30/18  2:35 PM  Result Value Ref Range Status   MRSA, PCR NEGATIVE NEGATIVE Final   Staphylococcus aureus POSITIVE (A) NEGATIVE Final    Comment: (NOTE) The Xpert SA Assay (FDA approved for NASAL specimens in patients 30 years of age and older), is one component of a comprehensive surveillance program. It is not intended to diagnose infection nor to guide or monitor treatment. Performed at [facility], [address], [place], [place] [address]        Inpatient Medications:   Scheduled Meds: . fentaNYL       Continuous Infusions: . acetaminophen    . lactated ringers 125 mL/hr at 06/05/18 0944  . lactated ringers       Radiological Exams on Admission: No results found.  Impression/Recommendations Principal Problem:   HNP (herniated nucleus pulposus), lumbar Active Problems:   Spinal stenosis at L4-L5 level  Prediabetes with hyperglycemia -Recent hemoglobin A1c was 6 on 05/30/2018. -We will monitor blood sugars and cover with insulin sliding scale.  Consult dietitian.  Diabetes counselor consult. -Advised the patient to work on his diet and exercise and try and lose some weight.  Outpatient follow-up with primary care provider.  Morbid obesity -Outpatient follow-up with PCP  Hypertension -Monitor blood pressure.  Continue home lisinopril.  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  Management as per the primary team   Thank you for this consultation.  Our [facility] hospitalist team will follow the patient with you.    Glade Lloyd M.D. [facility] Pager:  [phone] 06/05/2018, 3:16 PM

## 2018-06-06 ENCOUNTER — Encounter (HOSPITAL_COMMUNITY): Payer: Self-pay | Admitting: Specialist

## 2018-06-06 DIAGNOSIS — M5126 Other intervertebral disc displacement, lumbar region: Secondary | ICD-10-CM | POA: Diagnosis not present

## 2018-06-06 LAB — GLUCOSE, CAPILLARY
GLUCOSE-CAPILLARY: 174 mg/dL — AB (ref 70–99)
Glucose-Capillary: 312 mg/dL — ABNORMAL HIGH (ref 70–99)

## 2018-06-06 LAB — BASIC METABOLIC PANEL
Anion gap: 10 (ref 5–15)
BUN: 6 mg/dL (ref 6–20)
CALCIUM: 9 mg/dL (ref 8.9–10.3)
CO2: 24 mmol/L (ref 22–32)
CREATININE: 0.69 mg/dL (ref 0.61–1.24)
Chloride: 101 mmol/L (ref 98–111)
GFR calc Af Amer: >60 mL/min (ref >60)
GFR calc non Af Amer: >60 mL/min (ref >60)
Glucose, Bld: 193 mg/dL — ABNORMAL HIGH (ref 70–99)
Potassium: 4.1 mmol/L (ref 3.5–5.1)
Sodium: 135 mmol/L (ref 135–145)

## 2018-06-06 MED FILL — Thrombin (Recombinant) For Soln 20000 Unit: CUTANEOUS | Qty: 1 | Status: AC

## 2018-06-06 NOTE — Evaluation (Signed)
Physical Therapy Evaluation and Discharge  Patient Details Name: Richard Bishop MRN: [id number] DOB: Dec 22, 1980 Today's Date: 06/06/2018   History of Present Illness  Pt is a 37 y/o male who presents s/p L4-L5 laminectomy/decompression on 06/05/18.   Clinical Impression  Patient evaluated by Physical Therapy with no further acute PT needs identified. All education has been completed and the patient has no further questions. At the time of PT eval pt was able to perform transfers and ambulation with gross modified independence to supervision for safety. Pt was educated on precautions, activity progression, car transfer, and appropriate use/need for DME. Recommend a 3-in-1 but do not feel he needs a RW at this time. See below for any follow-up Physical Therapy or equipment needs. PT is signing off. Thank you for this referral.     Follow Up Recommendations No PT follow up;Supervision for mobility/OOB    Equipment Recommendations  3in1 (PT)    Recommendations for Other Services       Precautions / Restrictions Precautions Precautions: Fall;Back Precaution Booklet Issued: Yes (comment) Precaution Comments: Reviewed handout with pt and family. He was cued for maintenance of precautions during functional mobility.  Restrictions Weight Bearing Restrictions: No      Mobility  Bed Mobility Overal bed mobility: Needs Assistance Bed Mobility: Rolling;Sidelying to Sit Rolling: Modified independent (Device/Increase time) Sidelying to sit: Supervision       General bed mobility comments: VC's for proper log roll technique. Pt was able to transition to EOB without assist however slow and appeared effortful due to pain.   Transfers Overall transfer level: Needs assistance Equipment used: None Transfers: Sit to/from Stand Sit to Stand: Supervision         General transfer comment: Pt wanting to push up from walker to stand. Instructed on transfers without an AD as he did  not need it to walk, and was only using the walker for support for transfers.   Ambulation/Gait Ambulation/Gait assistance: Modified independent (Device/Increase time) Gait Distance (Feet): 400 Feet Assistive device: None Gait Pattern/deviations: Step-through pattern;Decreased stride length Gait velocity: Decreased Gait velocity interpretation: 1.31 - 2.62 ft/sec, indicative of limited community ambulator General Gait Details: VC's for improved posture. No overt LOB noted throughout gait training.   Stairs Stairs: Yes Stairs assistance: Min guard Stair Management: One rail Right;Step to pattern;Forwards Number of Stairs: 5 General stair comments: VC's for sequencing and general safety. No assist required.   Wheelchair Mobility    Modified Rankin (Stroke Patients Only)       Balance Overall balance assessment: Needs assistance Sitting-balance support: Feet supported;No upper extremity supported Sitting balance-Leahy Scale: Fair     Standing balance support: No upper extremity supported Standing balance-Leahy Scale: Fair                               Pertinent Vitals/Pain Pain Assessment: Faces Faces Pain Scale: Hurts whole lot Pain Location: Low back with movement Pain Descriptors / Indicators: Operative site guarding Pain Intervention(s): Limited activity within patient's tolerance;Monitored during session;Repositioned    Home Living Family/patient expects to be discharged to:: Private residence Living Arrangements: Spouse/significant other Available Help at Discharge: Family Type of Home: House Home Access: Stairs to enter   Secretary/administrator of Steps: 5 Home Layout: Two level;Able to live on main level with bedroom/bathroom Home Equipment: Gilmer Mor - single point      Prior Function Level of Independence: Independent  Comments: 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     Hand Dominance        Extremity/Trunk Assessment   Upper  Extremity Assessment Upper Extremity Assessment: Overall WFL for tasks assessed    Lower Extremity Assessment Lower Extremity Assessment: Overall WFL for tasks assessed    Cervical / Trunk Assessment Cervical / Trunk Assessment: Other exceptions Cervical / Trunk Exceptions: s/p surgery  Communication      Cognition Arousal/Alertness: Awake/alert Behavior During Therapy: WFL for tasks assessed/performed Overall Cognitive Status: Within Functional Limits for tasks assessed                                        General Comments      Exercises     Assessment/Plan    PT Assessment Patent does not need any further PT services  PT Problem List         PT Treatment Interventions      PT Goals (Current goals can be found in the Care Plan section)  Acute Rehab PT Goals Patient Stated Goal: Home today PT Goal Formulation: All assessment and education complete, DC therapy    Frequency     Barriers to discharge        Co-evaluation               AM-PAC PT "6 Clicks" Daily Activity  Outcome Measure Difficulty turning over in bed (including adjusting bedclothes, sheets and blankets)?: None Difficulty moving from lying on back to sitting on the side of the bed? : A Little Difficulty sitting down on and standing up from a chair with arms (e.g., wheelchair, bedside commode, etc,.)?: A Little Help needed moving to and from a bed to chair (including a wheelchair)?: A Little Help needed walking in hospital room?: A Little Help needed climbing 3-5 steps with a railing? : A Little 6 Click Score: 19    End of Session Equipment Utilized During Treatment: Gait belt Activity Tolerance: Patient tolerated treatment well Patient left: with call bell/phone within reach(Sitting EOB) Nurse Communication: Mobility status PT Visit Diagnosis: Unsteadiness on feet (R26.81);Pain Pain - part of body: (incision site - back)    Time: 1610-9604 PT Time Calculation  (min) (ACUTE ONLY): 24 min   Charges:   PT Evaluation $PT Eval Moderate Complexity: 1 Mod PT Treatments $Gait Training: 8-22 mins        Conni Slipper, PT, DPT Acute Rehabilitation Services Pager: [phone] Office: [phone]   Marylynn Pearson 06/06/2018, 2:06 PM

## 2018-06-06 NOTE — Progress Notes (Signed)
Patient ID: Richard Bishop, male   DOB: [birth year], 37 y.o.   MRN: [id number] Patient still has hyperglycemia.  He was advised to stick to diabetic diet and to follow-up with PCP as an outpatient.  He is probably being planned for discharge today by the primary team.

## 2018-06-06 NOTE — Progress Notes (Signed)
Patient is discharged from room 3C11 at this time. Alert and in stable condition. IV site d/c'd and instructions read to patient and spouse with understanding verbalized. Left unit via wheelchair with all belongings at side. 

## 2018-06-06 NOTE — Discharge Instructions (Signed)
Walk As Tolerated utilizing back precautions.  No bending, twisting, or lifting.  No driving for 2 weeks.   Aquacel dressing may remain in place until follow up. May shower with aquacel dressing in place. If the dressing peels off or becomes saturated, you may remove aquacel dressing and place gauze and tape dressing which should be kept clean and dry and changed daily. Do not remove steri-strips if they are present. See Dr. Shelle Iron in office in 10 to 14 days. Begin taking aspirin 81mg  per day starting 4 days after your surgery if not allergic to aspirin or on another blood thinner. Walk daily even outside. Use a cane or walker only if necessary. Avoid sitting on soft sofas.  Follow up with primary care provider regarding diabetes diagnosis

## 2018-06-06 NOTE — Progress Notes (Signed)
Subjective: 1 Day Post-Op Procedure(s) (LRB): Microlumbar decompression Lumbar Four - Lumbar Five left (Left) Patient reports pain as moderate.   Reports radicular pain improved. Noting soreness in back. No other c/o. Voiding without difficulty.  Objective: Vital signs in last 24 hours: Temp:  [97.3 F (36.3 C)-98.4 F (36.9 C)] 97.9 F (36.6 C) (10/25 0334) Pulse Rate:  [72-108] 97 (10/25 0334) Resp:  [10-22] 20 (10/25 0334) BP: (103-161)/(55-97) 129/73 (10/25 0334) SpO2:  [94 %-100 %] 95 % (10/25 0334)  Intake/Output from previous day: 10/24 0701 - 10/25 0700 In: 1991.5 [P.O.:480; I.V.:1027.9; IV Piggyback:483.6] Out: 50 [Blood:50] Intake/Output this shift: No intake/output data recorded.  No results for input(s): HGB in the last 72 hours. No results for input(s): WBC, RBC, HCT, PLT in the last 72 hours. Recent Labs    06/06/18 0538  NA 135  K 4.1  CL 101  CO2 24  BUN 6  CREATININE 0.69  GLUCOSE 193*  CALCIUM 9.0   No results for input(s): LABPT, INR in the last 72 hours.  Neurologically intact ABD soft Neurovascular intact Sensation intact distally Intact pulses distally Dorsiflexion/Plantar flexion intact Incision: dressing C/D/I and no drainage No cellulitis present Compartment soft no calf pain or sign of DVT  Assessment/Plan: 1 Day Post-Op Procedure(s) (LRB): Microlumbar decompression Lumbar Four - Lumbar Five left (Left) Advance diet Up with therapy D/C IV fluids Appreciate hospitalist recommendations due to hyperglycemia, pt to follow with PCP as outpt Discussed D/C instructions, Lspine precautions, dressing instructions D/C home today Will discuss with Dr. Elissa Lovett, Shanikka Wonders M. 06/06/2018, 8:00 AM

## 2018-06-06 NOTE — Discharge Summary (Signed)
Physician Discharge Summary   Patient ID: Richard Bishop MRN: [id number] DOB/AGE: [birth year] 37 y.o.  Admit date: 06/05/2018 Discharge date: 06/06/2018  Primary Diagnosis:   HNP, stenosis L4-5 Left  Admission Diagnoses:  Past Medical History:  Diagnosis Date  . Asthma    as a child  . Hypertension    Discharge Diagnoses:   Principal Problem:   HNP (herniated nucleus pulposus), lumbar Active Problems:   Spinal stenosis at L4-L5 level  Procedure:  Procedure(s) (LRB): Microlumbar decompression Lumbar Four - Lumbar Five left (Left)   Consults: Hospitalist for DM  HPI:  see H&P    Laboratory Data: Hospital Outpatient Visit on 05/30/2018  Component Date Value Ref Range Status  . Sodium 05/30/2018 134* 135 - 145 mmol/L Final  . Potassium 05/30/2018 3.8  3.5 - 5.1 mmol/L Final  . Chloride 05/30/2018 105  98 - 111 mmol/L Final  . CO2 05/30/2018 22  22 - 32 mmol/L Final  . Glucose, Bld 05/30/2018 238* 70 - 99 mg/dL Final  . BUN 05/30/2018 9  6 - 20 mg/dL Final  . Creatinine, Ser 05/30/2018 0.80  0.61 - 1.24 mg/dL Final  . Calcium 05/30/2018 8.7* 8.9 - 10.3 mg/dL Final  . GFR calc non Af Amer 05/30/2018 >60  >60 mL/min Final  . GFR calc Af Amer 05/30/2018 >60  >60 mL/min Final   Comment: (NOTE) The eGFR has been calculated using the CKD EPI equation. This calculation has not been validated in all clinical situations. eGFR's persistently <60 mL/min signify possible Chronic Kidney Disease.   Georgiann Hahn gap 05/30/2018 7  5 - 15 Final   Performed at [facility], [place] [address] [place], [place] [address]  . WBC 05/30/2018 5.2  4.0 - 10.5 K/uL Final  . RBC 05/30/2018 4.98  4.22 - 5.81 MIL/uL Final  . Hemoglobin 05/30/2018 15.2  13.0 - 17.0 g/dL Final  . HCT 05/30/2018 45.2  39.0 - 52.0 % Final  . MCV 05/30/2018 90.8  80.0 - 100.0 fL Final  . MCH 05/30/2018 30.5  26.0 - 34.0 pg Final  . MCHC 05/30/2018 33.6  30.0 - 36.0 g/dL Final  . RDW 05/30/2018 11.9   11.5 - 15.5 % Final  . Platelets 05/30/2018 293  150 - 400 K/uL Final  . nRBC 05/30/2018 0.0  0.0 - 0.2 % Final   Performed at [facility], [place] [address] [place], [place] [address]  . MRSA, PCR 05/30/2018 NEGATIVE  NEGATIVE Final  . Staphylococcus aureus 05/30/2018 POSITIVE* NEGATIVE Final   Comment: (NOTE) The Xpert SA Assay (FDA approved for NASAL specimens in patients 67 years of age and older), is one component of a comprehensive surveillance program. It is not intended to diagnose infection nor to guide or monitor treatment. Performed at [facility], [place] [address], [place], [place] [address]   . Hgb A1c MFr Bld 05/30/2018 6.0* 4.8 - 5.6 % Final   Comment: (NOTE) Pre diabetes:          5.7%-6.4% Diabetes:              >6.4% Glycemic control for   <7.0% adults with diabetes   . Mean Plasma Glucose 05/30/2018 125.5  mg/dL Final   Performed at [facility] [address] [place], [place] [address]   No results for input(s): HGB in the last 72 hours. No results for input(s): WBC, RBC, HCT, PLT in the last 72 hours. Recent Labs  06/06/18 0538  NA 135  K 4.1  CL 101  CO2 24  BUN 6  CREATININE 0.69  GLUCOSE 193*  CALCIUM 9.0   No results for input(s): LABPT, INR in the last 72 hours.  X-Rays:Dg Lumbar Spine 2-3 Views  Result Date: 06/05/2018 CLINICAL DATA:  Lumbar decompression at L4-5 EXAM: LUMBAR SPINE - 2-3 VIEW COMPARISON:  05/30/2018 FINDINGS: Three lateral views of the lumbar spine were obtained intraoperatively. The initial film is limited by motion artifact but demonstrates needles in the posterior soft tissues at the L4 and L5 spinous process levels. Subsequent film shows retractors and surgical instruments posterior to the L5 vertebral body. The final film shows a surgical instrument within the L4-5 disc space. IMPRESSION: Intraoperative localization as described. Electronically Signed   By: Inez Catalina M.D.   On: 06/05/2018 15:40   Dg  Lumbar Spine 2-3 Views  Result Date: 05/30/2018 CLINICAL DATA:  Preoperative evaluation. EXAM: LUMBAR SPINE - 2-3 VIEW COMPARISON:  Chest x-ray from 03/18/2013 FINDINGS: Five lumbar type vertebral bodies are well visualized. Hypoplastic twelfth ribs are noted. Vertebral body height is well maintained. Mild disc space narrowing at L4-5 is seen. No gross soft tissue abnormality is noted. IMPRESSION: No acute abnormality noted. Standard numbering nomenclature is utilized. Correlate with any previous imaging. Electronically Signed   By: Inez Catalina M.D.   On: 05/30/2018 16:19    EKG: Orders placed or performed during the hospital encounter of 06/05/18  . EKG 12-Lead  . EKG 12-Lead     Hospital Course: Patient was admitted to [facility] and taken to the OR and underwent the above state procedure without complications.  Patient tolerated the procedure well and was later transferred to the recovery room and then to the orthopaedic floor for postoperative care.  They were given PO and IV analgesics for pain control following their surgery.  They were given 24 hours of postoperative antibiotics.   PT was consulted postop to assist with mobility and transfers.  The patient was allowed to be WBAT with therapy and was taught back precautions. Discharge planning was consulted to help with postop disposition and equipment needs.  Patient had a good night on the evening of surgery and started to get up OOB with therapy on day one. Patient was seen in rounds and was ready to go home on day one.  They were given discharge instructions and dressing directions.  They were instructed on when to follow up in the office with Dr. Tonita Cong.   Diet: Diabetic diet Activity:WBAT, Lspine precautions Follow-up:in 10-14 days Disposition - Home Discharged Condition: good   Discharge Instructions    Call MD / Call 911   Complete by:  As directed    If you experience chest pain or shortness of breath, CALL 911 and  be transported to the hospital emergency room.  If you develope a fever above 101 F, pus (white drainage) or increased drainage or redness at the wound, or calf pain, call your surgeon's office.   Constipation Prevention   Complete by:  As directed    Drink plenty of fluids.  Prune juice may be helpful.  You may use a stool softener, such as Colace (over the counter) 100 mg twice a day.  Use MiraLax (over the counter) for constipation as needed.   Diet - low sodium heart healthy   Complete by:  As directed    Increase activity slowly as tolerated   Complete by:  As directed  Allergies as of 06/06/2018      Reactions   Shellfish Allergy Anaphylaxis, Swelling, Rash, Other (See Comments)   *has to be fresh* Swelling of mouth      Medication List    TAKE these medications   docusate sodium 100 MG capsule Commonly known as:  COLACE Take 1 capsule (100 mg total) by mouth 2 (two) times daily.   lisinopril 40 MG tablet Commonly known as:  PRINIVIL,ZESTRIL Take 40 mg by mouth daily.   methocarbamol 500 MG tablet Commonly known as:  ROBAXIN Take 1 tablet (500 mg total) by mouth every 6 (six) hours as needed for muscle spasms.   oxyCODONE 5 MG immediate release tablet Commonly known as:  Oxy IR/ROXICODONE Take 1-2 tablets (5-10 mg total) by mouth every 4 (four) hours as needed.   polyethylene glycol packet Commonly known as:  MIRALAX / GLYCOLAX Take 17 g by mouth daily.      Follow-up Information    Susa Day, MD Follow up in 2 week(s).   Specialty:  Orthopedic Surgery Contact information: [address] [place] [address] [phone]           Signed: Lacie Draft, PA-C Orthopaedic Surgery 06/06/2018, 8:03 AM

## 2018-07-17 ENCOUNTER — Ambulatory Visit
Admission: RE | Admit: 2018-07-17 | Discharge: 2018-07-17 | Disposition: A | Discharge status: Home / Self Care | Payer: BLUE CROSS/BLUE SHIELD | Source: Ambulatory Visit | Attending: Physician Assistant | Admitting: Physician Assistant

## 2018-07-17 DIAGNOSIS — R748 Abnormal levels of other serum enzymes: Secondary | ICD-10-CM

## 2019-09-19 IMAGING — US US ABDOMEN LIMITED
1 series · 14 of 25 positions shown · non-contrast
Comparison: None.

CLINICAL DATA: Abnormal liver function tests

EXAM:
ULTRASOUND ABDOMEN LIMITED RIGHT UPPER QUADRANT

[Series 1: us abdomen limited · 0.31mm/px · 14 of 41 slices shown]
[im 1/41]
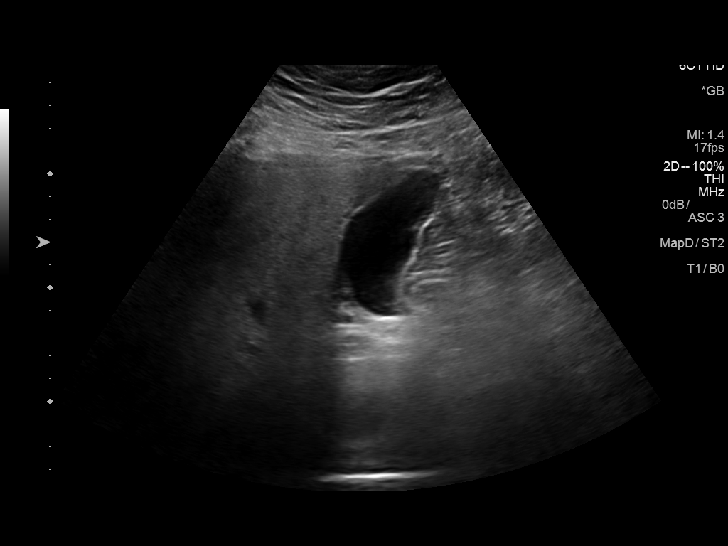
[im 4/41]
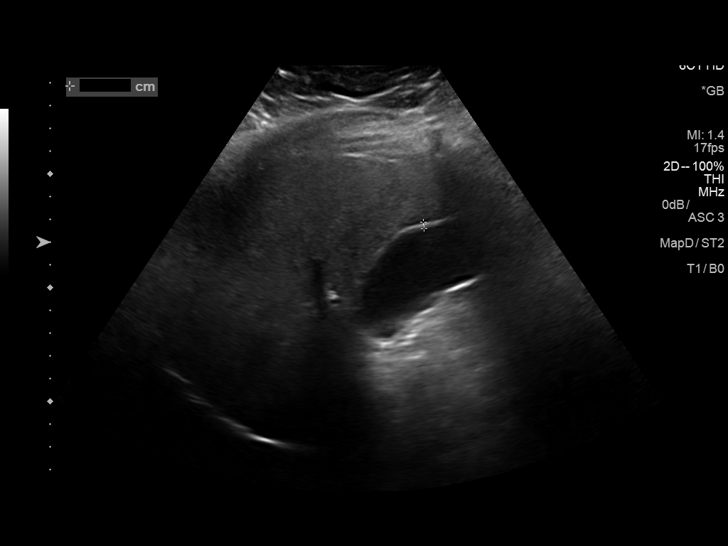
[im 7/41]
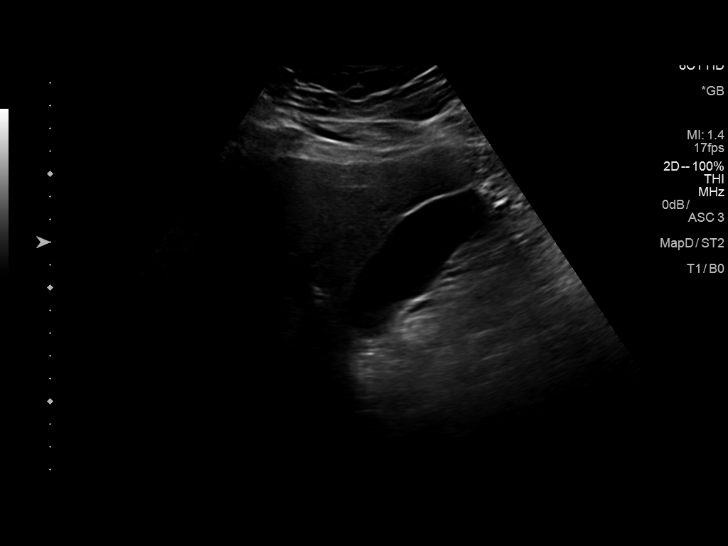
[im 11/41]
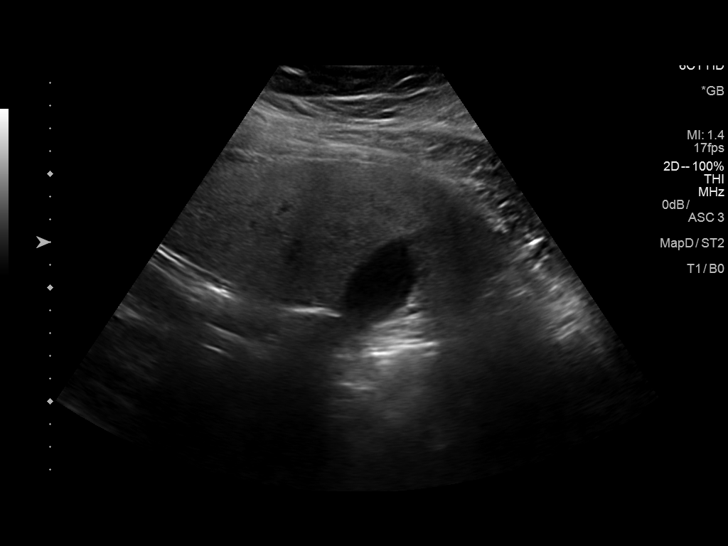
[im 14/41]
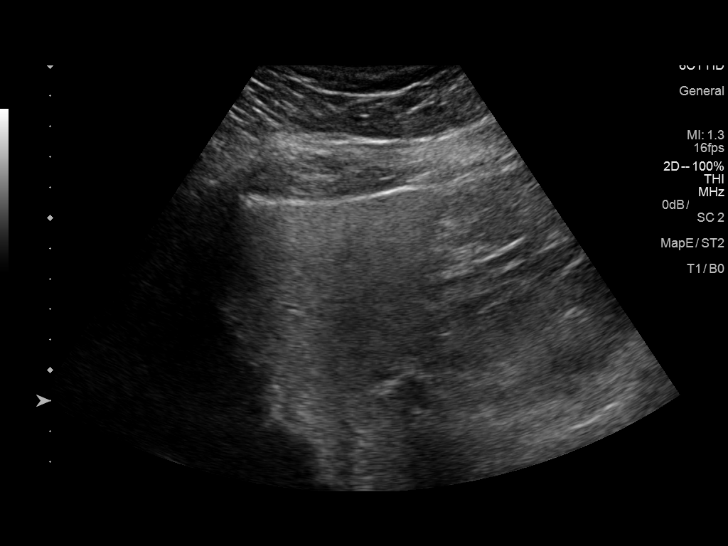
[im 16/41]
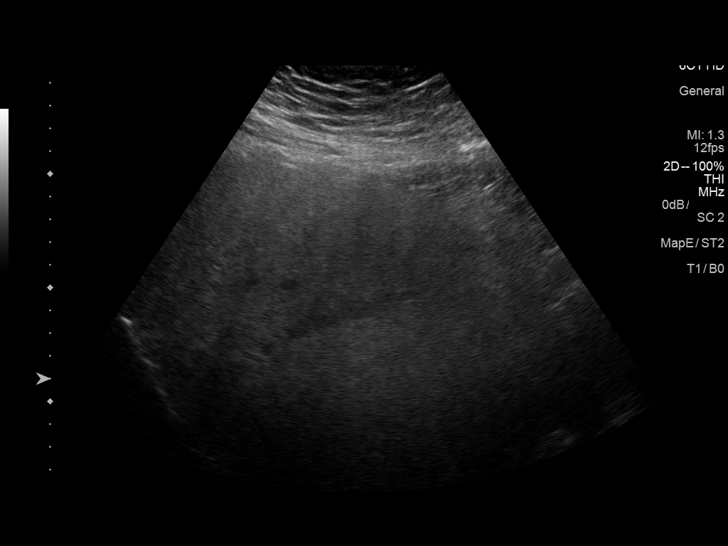
[im 19/41]
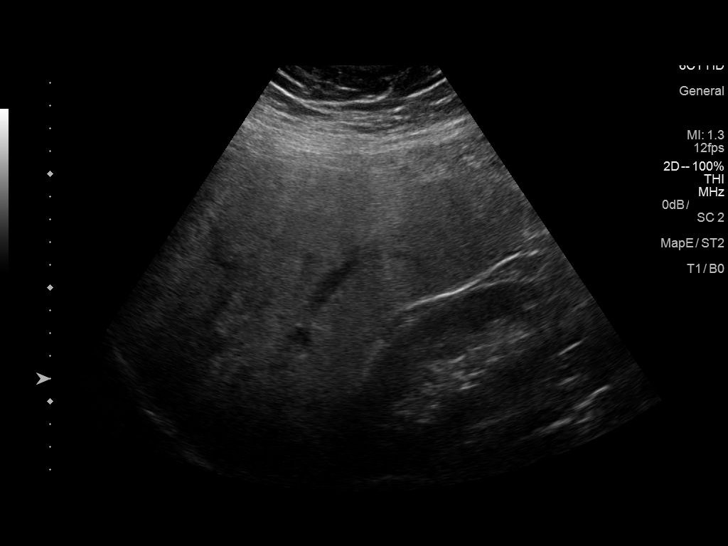
[im 22/41]
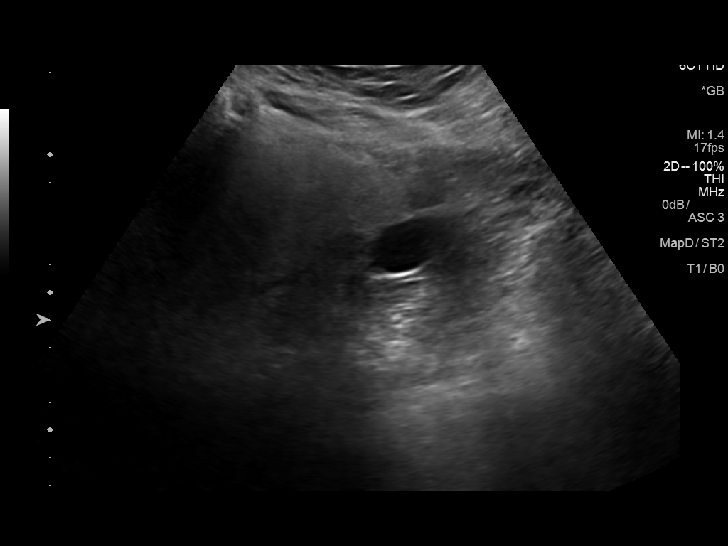
[im 26/41]
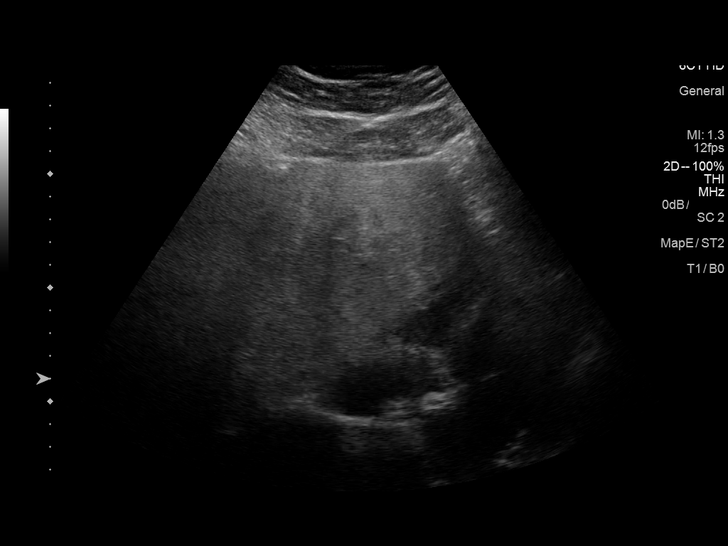
[im 27/41]
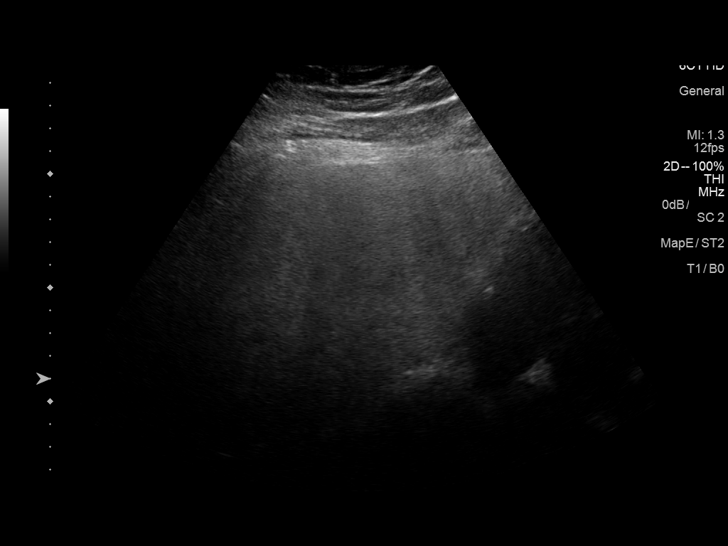
[im 31/41]
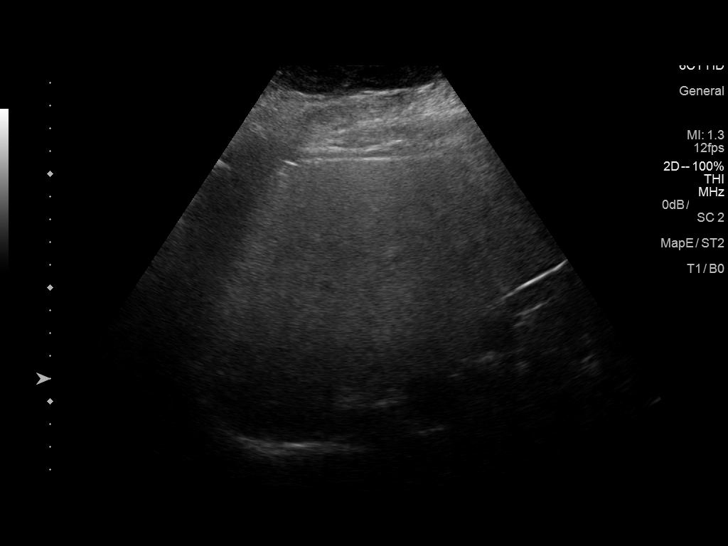
[im 34/41]
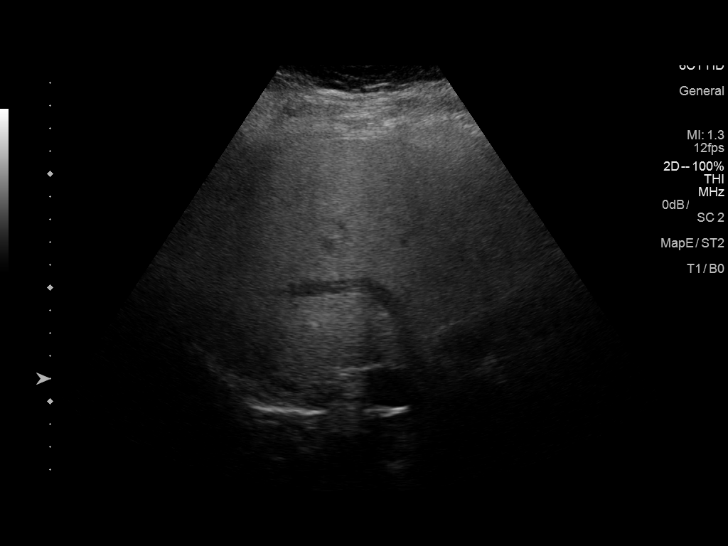
[im 37/41]
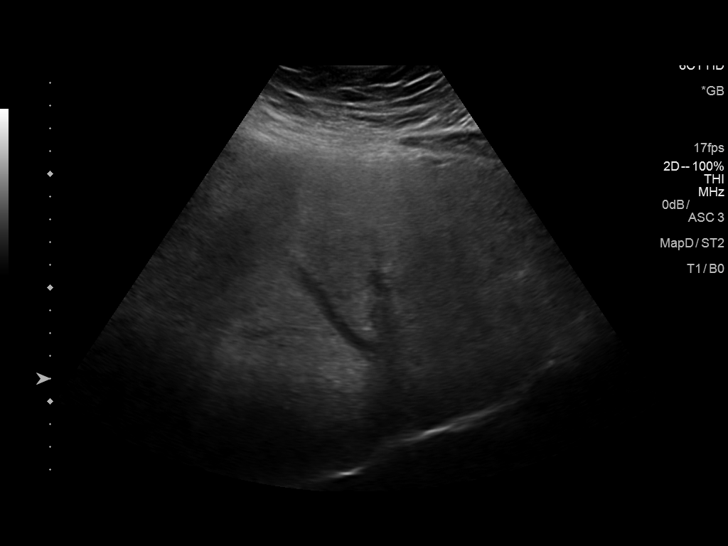
[im 41/41]
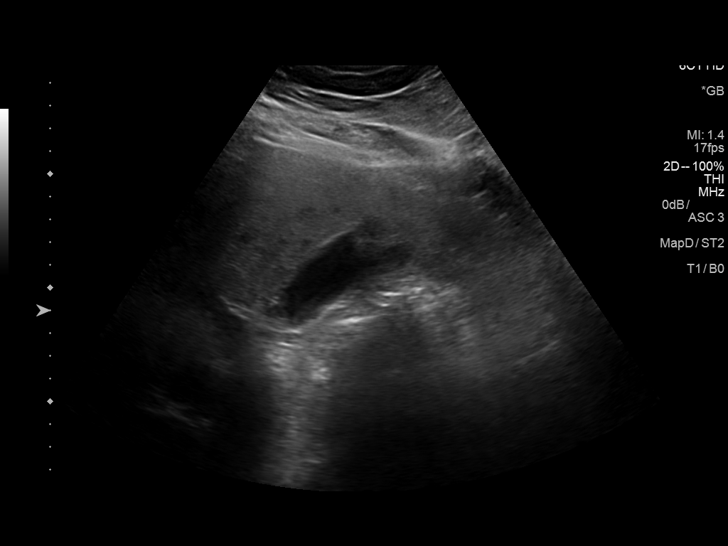

[14 of 25 positions shown; findings below may reference images not displayed]

FINDINGS: Gallbladder:

No gallstones or wall thickening visualized. No sonographic Murphy
sign noted by sonographer.

Common bile duct:

Diameter: 4.7 mm.

Liver:

Diffusely increased in echogenicity is consistent with diffuse
hepatic steatosis. There is relative sparing adjacent to the
gallbladder. No definite focal mass. Portal vein is patent on color
Doppler imaging with normal direction of blood flow towards the
liver.
IMPRESSION: Diffuse hepatic steatosis with relative sparing adjacent to the
gallbladder.

Normal gallbladder and biliary tree.

## 2020-03-16 IMAGING — CR DG LUMBAR SPINE 2-3V
3 series · 3 of 3 positions shown · non-contrast
Comparison: 05/30/2018

CLINICAL DATA: Lumbar decompression at L4-5

EXAM:
LUMBAR SPINE - 2-3 VIEW

[AP (1 of 2)]
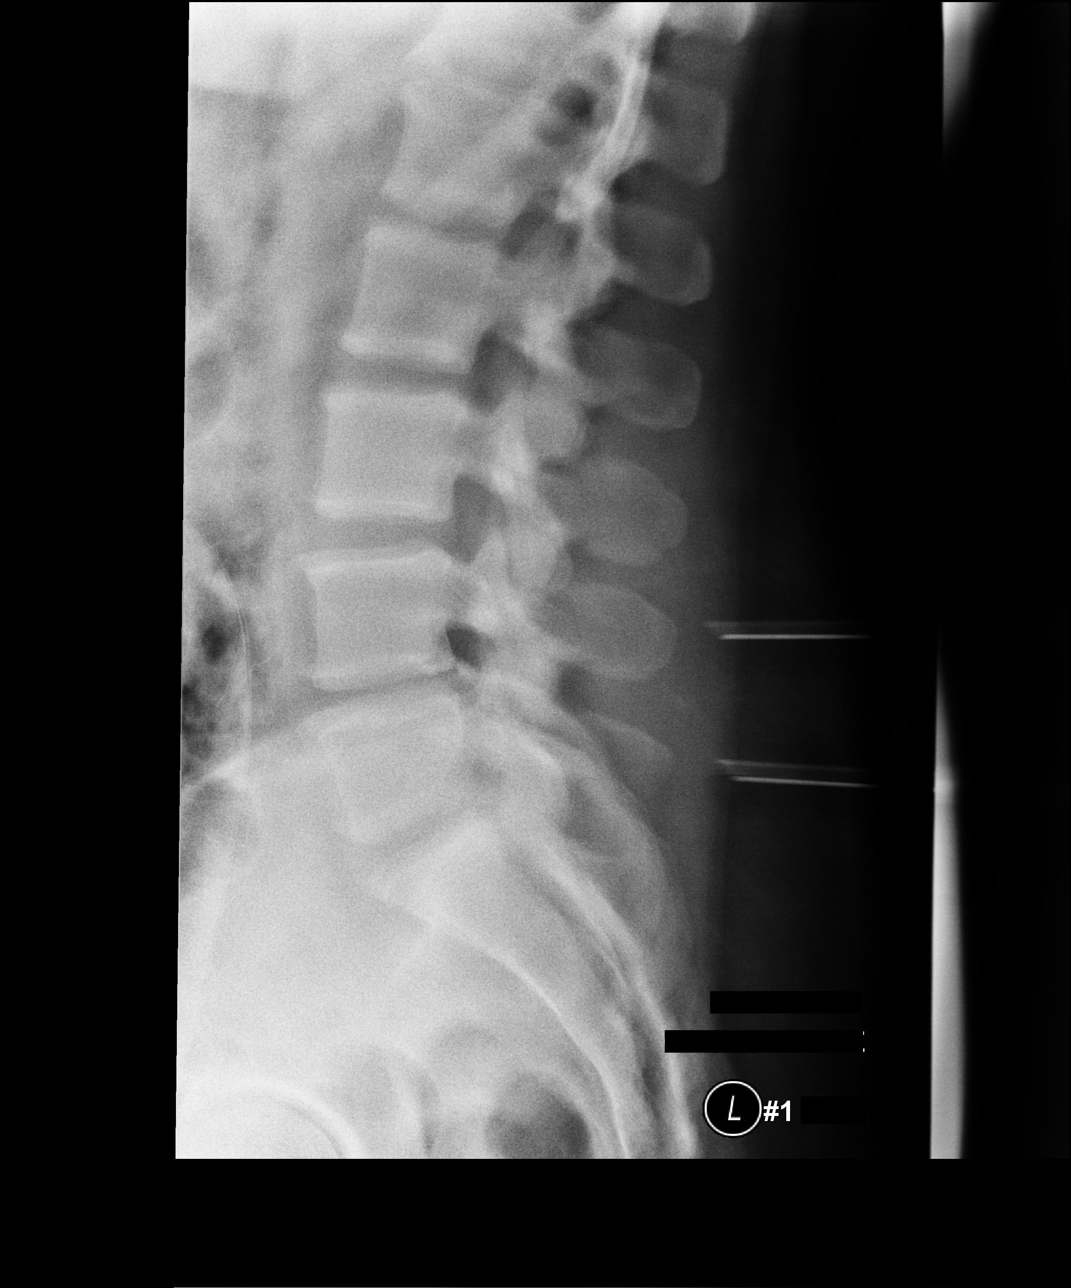

[AP (2 of 2)]
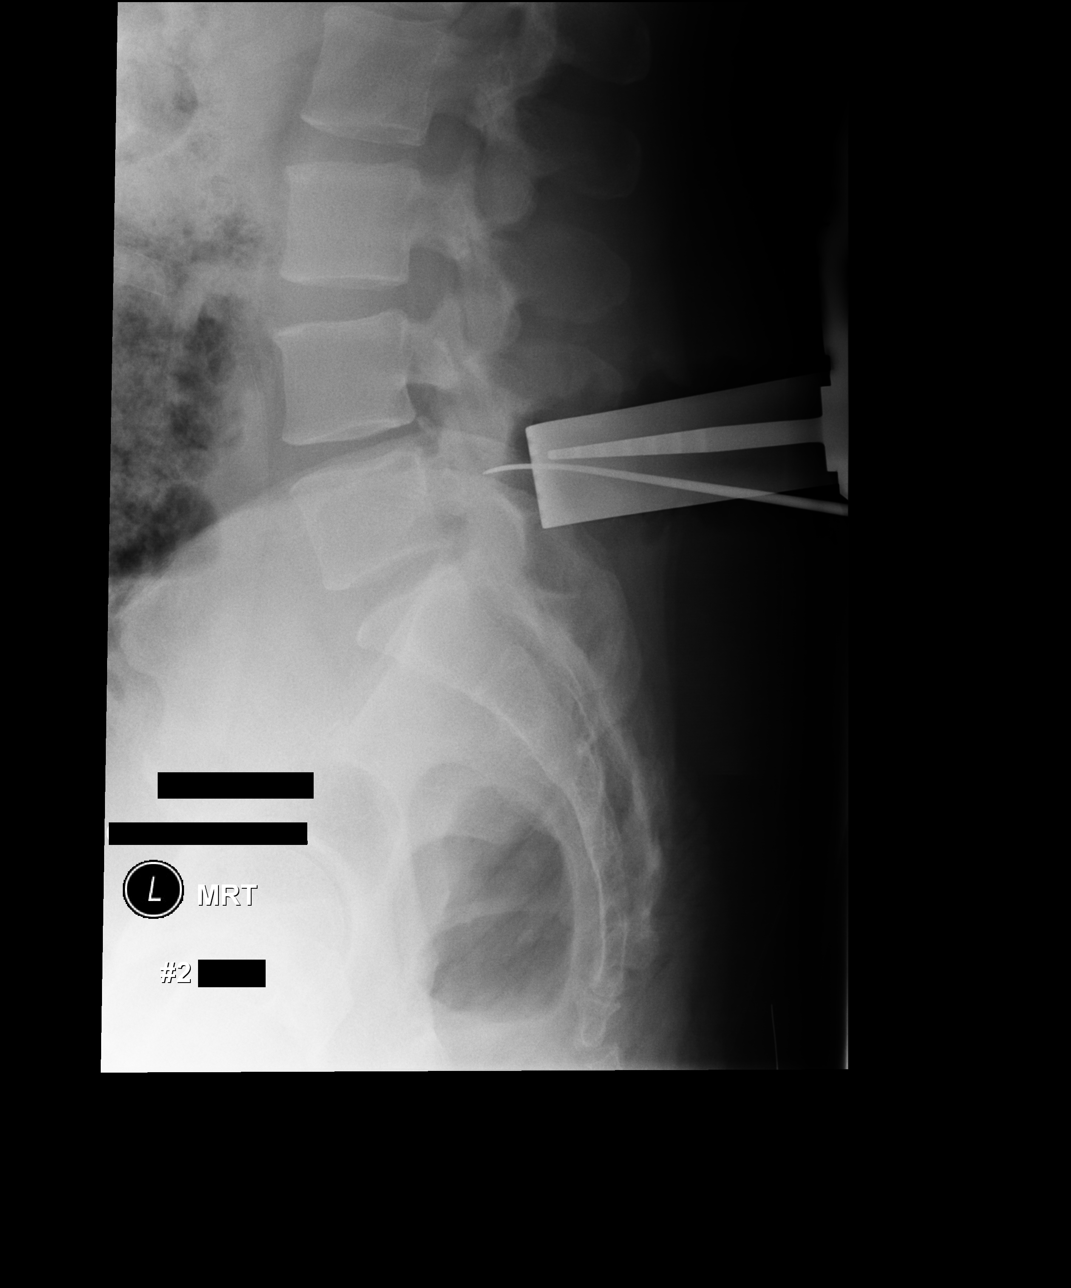

[lateral]
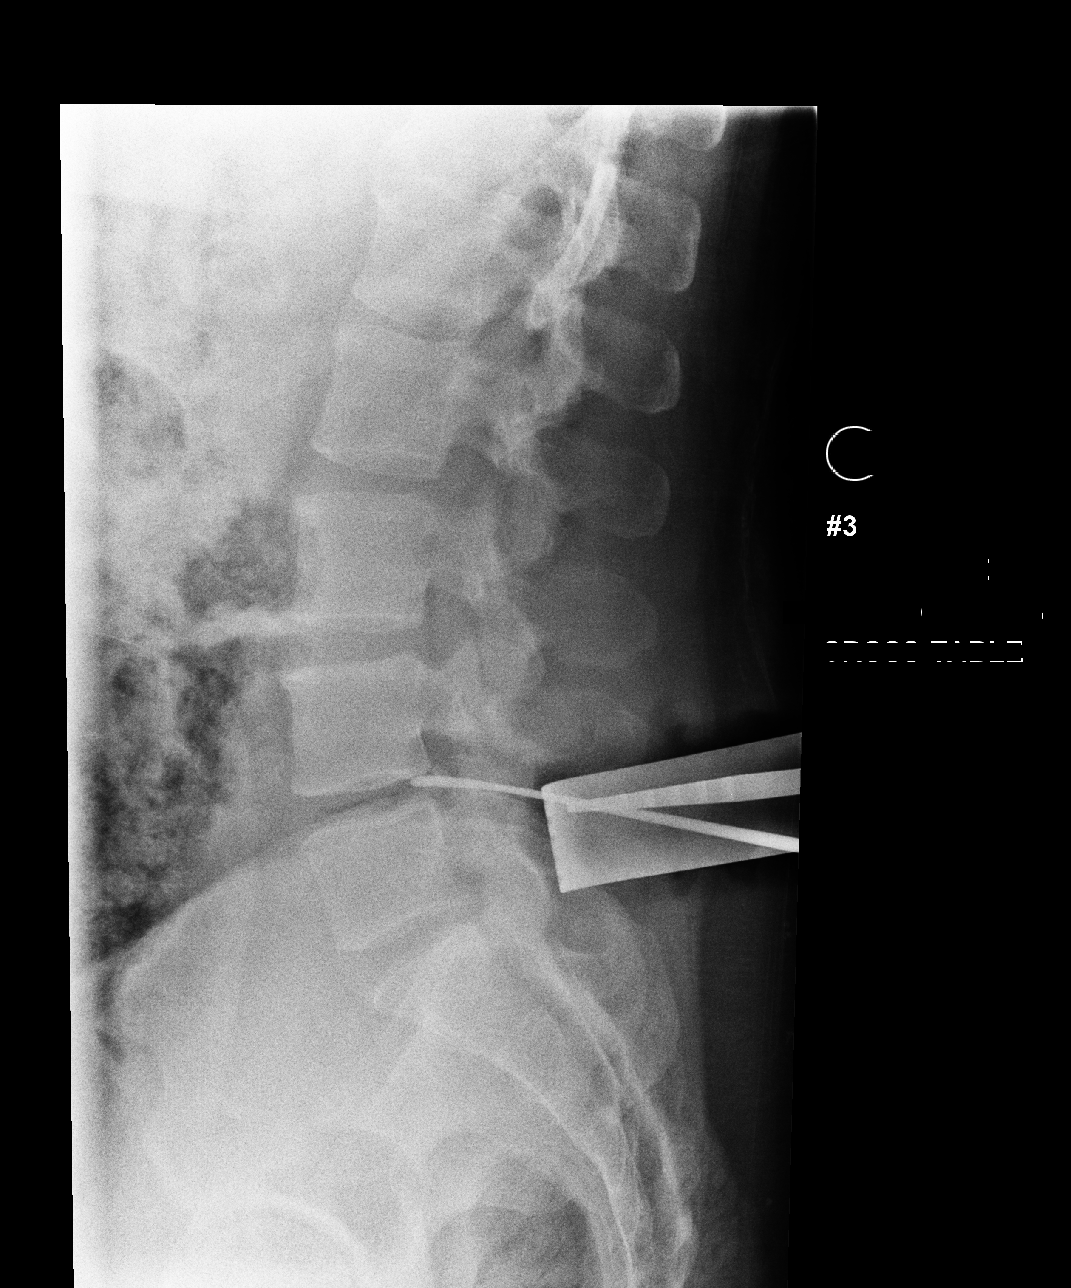

[3 of 3 positions shown; findings below may reference images not displayed]

FINDINGS: Three lateral views of the lumbar spine were obtained
intraoperatively. The initial film is limited by motion artifact but
demonstrates needles in the posterior soft tissues at the L4 and L5
spinous process levels. Subsequent film shows retractors and
surgical instruments posterior to the L5 vertebral body. The final
film shows a surgical instrument within the L4-5 disc space.
IMPRESSION: Intraoperative localization as described.
